# Patient Record
Sex: Female | Born: 1980 | Race: White | Hispanic: No | Marital: Single | Smoking: Current every day smoker
Health system: Southern US, Community
[De-identification: ages and names within clinical notes are randomized; demographics above are authoritative.]

## PROBLEM LIST (undated history)

## (undated) DIAGNOSIS — R569 Unspecified convulsions: Secondary | ICD-10-CM

## (undated) DIAGNOSIS — F32A Depression, unspecified: Secondary | ICD-10-CM

## (undated) DIAGNOSIS — K219 Gastro-esophageal reflux disease without esophagitis: Secondary | ICD-10-CM

## (undated) DIAGNOSIS — F329 Major depressive disorder, single episode, unspecified: Secondary | ICD-10-CM

## (undated) DIAGNOSIS — F419 Anxiety disorder, unspecified: Secondary | ICD-10-CM

## (undated) DIAGNOSIS — J45909 Unspecified asthma, uncomplicated: Secondary | ICD-10-CM

## (undated) HISTORY — PX: TONSILLECTOMY: SUR1361

---

## 2018-04-09 DIAGNOSIS — D696 Thrombocytopenia, unspecified: Secondary | ICD-10-CM | POA: Diagnosis not present

## 2018-04-09 DIAGNOSIS — D72819 Decreased white blood cell count, unspecified: Secondary | ICD-10-CM | POA: Diagnosis not present

## 2018-04-09 DIAGNOSIS — T50901A Poisoning by unspecified drugs, medicaments and biological substances, accidental (unintentional), initial encounter: Secondary | ICD-10-CM | POA: Diagnosis not present

## 2018-04-09 DIAGNOSIS — R740 Nonspecific elevation of levels of transaminase and lactic acid dehydrogenase [LDH]: Secondary | ICD-10-CM | POA: Diagnosis not present

## 2018-04-10 DIAGNOSIS — T1491XA Suicide attempt, initial encounter: Secondary | ICD-10-CM | POA: Diagnosis not present

## 2018-04-10 DIAGNOSIS — D696 Thrombocytopenia, unspecified: Secondary | ICD-10-CM | POA: Diagnosis not present

## 2018-04-10 DIAGNOSIS — D72819 Decreased white blood cell count, unspecified: Secondary | ICD-10-CM | POA: Diagnosis not present

## 2018-04-10 DIAGNOSIS — F1010 Alcohol abuse, uncomplicated: Secondary | ICD-10-CM | POA: Diagnosis not present

## 2018-04-10 DIAGNOSIS — R740 Nonspecific elevation of levels of transaminase and lactic acid dehydrogenase [LDH]: Secondary | ICD-10-CM | POA: Diagnosis not present

## 2018-04-10 DIAGNOSIS — T50901A Poisoning by unspecified drugs, medicaments and biological substances, accidental (unintentional), initial encounter: Secondary | ICD-10-CM | POA: Diagnosis not present

## 2018-04-11 ENCOUNTER — Other Ambulatory Visit: Payer: Self-pay

## 2018-04-11 ENCOUNTER — Inpatient Hospital Stay (HOSPITAL_COMMUNITY)
Admission: AD | Admit: 2018-04-11 | Discharge: 2018-04-13 | DRG: 885 | Disposition: A | Discharge status: Home / Self Care | Payer: Medicaid Other | Source: Intra-hospital | Attending: Internal Medicine | Admitting: Internal Medicine

## 2018-04-11 ENCOUNTER — Encounter (HOSPITAL_COMMUNITY): Payer: Self-pay

## 2018-04-11 ENCOUNTER — Emergency Department (HOSPITAL_COMMUNITY): Admission: EM | Admit: 2018-04-11 | Payer: Self-pay | Source: Home / Self Care

## 2018-04-11 DIAGNOSIS — R569 Unspecified convulsions: Secondary | ICD-10-CM | POA: Diagnosis not present

## 2018-04-11 DIAGNOSIS — Z915 Personal history of self-harm: Secondary | ICD-10-CM | POA: Diagnosis not present

## 2018-04-11 DIAGNOSIS — F332 Major depressive disorder, recurrent severe without psychotic features: Principal | ICD-10-CM | POA: Diagnosis present

## 2018-04-11 DIAGNOSIS — N390 Urinary tract infection, site not specified: Secondary | ICD-10-CM | POA: Diagnosis not present

## 2018-04-11 DIAGNOSIS — R740 Nonspecific elevation of levels of transaminase and lactic acid dehydrogenase [LDH]: Secondary | ICD-10-CM | POA: Diagnosis not present

## 2018-04-11 DIAGNOSIS — Z888 Allergy status to other drugs, medicaments and biological substances status: Secondary | ICD-10-CM | POA: Diagnosis not present

## 2018-04-11 DIAGNOSIS — Z8669 Personal history of other diseases of the nervous system and sense organs: Secondary | ICD-10-CM | POA: Diagnosis not present

## 2018-04-11 DIAGNOSIS — R443 Hallucinations, unspecified: Secondary | ICD-10-CM | POA: Diagnosis present

## 2018-04-11 DIAGNOSIS — F10231 Alcohol dependence with withdrawal delirium: Secondary | ICD-10-CM | POA: Diagnosis not present

## 2018-04-11 DIAGNOSIS — F419 Anxiety disorder, unspecified: Secondary | ICD-10-CM | POA: Diagnosis not present

## 2018-04-11 DIAGNOSIS — D539 Nutritional anemia, unspecified: Secondary | ICD-10-CM | POA: Diagnosis not present

## 2018-04-11 DIAGNOSIS — T50901A Poisoning by unspecified drugs, medicaments and biological substances, accidental (unintentional), initial encounter: Secondary | ICD-10-CM | POA: Diagnosis not present

## 2018-04-11 DIAGNOSIS — R45851 Suicidal ideations: Secondary | ICD-10-CM | POA: Diagnosis present

## 2018-04-11 DIAGNOSIS — Z79899 Other long term (current) drug therapy: Secondary | ICD-10-CM | POA: Diagnosis not present

## 2018-04-11 DIAGNOSIS — F502 Bulimia nervosa: Secondary | ICD-10-CM | POA: Diagnosis not present

## 2018-04-11 DIAGNOSIS — R4182 Altered mental status, unspecified: Secondary | ICD-10-CM | POA: Diagnosis not present

## 2018-04-11 DIAGNOSIS — F1010 Alcohol abuse, uncomplicated: Secondary | ICD-10-CM

## 2018-04-11 DIAGNOSIS — D72819 Decreased white blood cell count, unspecified: Secondary | ICD-10-CM | POA: Diagnosis not present

## 2018-04-11 DIAGNOSIS — T1491XA Suicide attempt, initial encounter: Secondary | ICD-10-CM

## 2018-04-11 DIAGNOSIS — R451 Restlessness and agitation: Secondary | ICD-10-CM | POA: Diagnosis not present

## 2018-04-11 DIAGNOSIS — F10931 Alcohol use, unspecified with withdrawal delirium: Secondary | ICD-10-CM

## 2018-04-11 DIAGNOSIS — K0889 Other specified disorders of teeth and supporting structures: Secondary | ICD-10-CM | POA: Diagnosis not present

## 2018-04-11 DIAGNOSIS — D696 Thrombocytopenia, unspecified: Secondary | ICD-10-CM | POA: Diagnosis not present

## 2018-04-11 DIAGNOSIS — I82409 Acute embolism and thrombosis of unspecified deep veins of unspecified lower extremity: Secondary | ICD-10-CM | POA: Diagnosis not present

## 2018-04-11 DIAGNOSIS — F17210 Nicotine dependence, cigarettes, uncomplicated: Secondary | ICD-10-CM | POA: Diagnosis present

## 2018-04-11 DIAGNOSIS — R590 Localized enlarged lymph nodes: Secondary | ICD-10-CM | POA: Diagnosis not present

## 2018-04-11 MED ORDER — TRAZODONE HCL 50 MG PO TABS
50.0000 mg | ORAL_TABLET | Freq: Every evening | ORAL | Status: DC | PRN
  Administered 2018-04-13: 50 mg via ORAL
  Filled 2018-04-11: qty 1

## 2018-04-11 MED ORDER — LORAZEPAM 2 MG/ML IJ SOLN: 0.0000 mg | Freq: Two times a day (BID) | INTRAMUSCULAR | Status: DC

## 2018-04-11 MED ORDER — MAGNESIUM HYDROXIDE 400 MG/5ML PO SUSP: 30.0000 mL | Freq: Every day | ORAL | Status: DC | PRN

## 2018-04-11 MED ORDER — LORAZEPAM 1 MG PO TABS: 0.0000 mg | ORAL_TABLET | Freq: Two times a day (BID) | ORAL | Status: DC

## 2018-04-11 MED ORDER — VITAMIN B-1 100 MG PO TABS: 100.0000 mg | ORAL_TABLET | Freq: Every day | ORAL | Status: DC

## 2018-04-11 MED ORDER — LORAZEPAM 1 MG PO TABS: 0.0000 mg | ORAL_TABLET | Freq: Four times a day (QID) | ORAL | Status: DC

## 2018-04-11 MED ORDER — ALUM & MAG HYDROXIDE-SIMETH 200-200-20 MG/5ML PO SUSP: 30.0000 mL | ORAL | Status: DC | PRN

## 2018-04-11 MED ORDER — HYDROXYZINE HCL 25 MG PO TABS: 25.0000 mg | ORAL_TABLET | Freq: Four times a day (QID) | ORAL | Status: DC | PRN

## 2018-04-11 MED ORDER — THIAMINE HCL 100 MG/ML IJ SOLN: 100.0000 mg | Freq: Every day | INTRAMUSCULAR | Status: DC

## 2018-04-11 MED ORDER — ACETAMINOPHEN 325 MG PO TABS: 650.0000 mg | ORAL_TABLET | Freq: Four times a day (QID) | ORAL | Status: DC | PRN

## 2018-04-11 MED ORDER — LORAZEPAM 2 MG/ML IJ SOLN
0.0000 mg | Freq: Four times a day (QID) | INTRAMUSCULAR | Status: DC
  Filled 2018-04-11: qty 1

## 2018-04-11 NOTE — Tx Team (Signed)
Initial Treatment Plan 04/11/2018 5:34 PM Jennifer Compton ZOX:[id number]    PATIENT STRESSORS: Financial difficulties   PATIENT STRENGTHS: Supportive family/friends   PATIENT IDENTIFIED PROBLEMS: Anxiety  Substance abuse  "coping skills"  "stop drinking alcohol"               DISCHARGE CRITERIA:  Improved stabilization in mood, thinking, and/or behavior Medical problems require only outpatient monitoring  PRELIMINARY DISCHARGE PLAN: Attend aftercare/continuing care group  PATIENT/FAMILY INVOLVEMENT: This treatment plan has been presented to and reviewed with the patient, Jennifer Compton, and/or family member.  The patient and family have been given the opportunity to ask questions and make suggestions.  Bethann Punches, RN 04/11/2018, 5:34 PM

## 2018-04-11 NOTE — Progress Notes (Cosign Needed)
Jennifer Compton is a 37 y.o. female Voluntary admitted for overdosing on her medications. Pt stated she 8 Trazodone and 2 Benadryl not in an attempt to kill herself but to try and go to sleep. Pt stated her goal is to learn coping skill on how to deal with depression and drinking. Pt alert and oriented x 4, denied SI/HI, AVH. Consents signed, skin/belongings search completed and pt oriented to unit. Pt stable at this time. Pt given the opportunity to express concerns and ask questions. Pt given toiletries. Will continue to monitor.

## 2018-04-11 NOTE — ED Provider Notes (Addendum)
[name] EMERGENCY DEPARTMENT Provider Note   CSN: [id number] Arrival date & time: 04/11/18  1546     History   Chief Complaint Chief Complaint  Patient presents with  . Seizures    HPI Jennifer Compton is a 37 y.o. female.  Patient presents to the emergency department with a chief complaint of seizures.  She is admitted to behavioral health for suicidal ideation and alcohol detox.  She was originally seen at [facility] on Friday (3 days ago).  She reports she had taken 8 trazodone and 2 Benadryl.  Upon being medically cleared, she was transferred to [facility] for further psychiatric care.  She states that today she was in an AA meeting and had a seizure.  The caregiver is with her, and states that she was seated.  She did not fall.  She did not injure herself.  She states that she has felt slightly shaky since, did have one episode of vomiting.  She states that she feels improved now.  She was sent to the emergency department for evaluation from behavioral health.  The history is provided by the patient. No language interpreter was used.    History reviewed. No pertinent past medical history.  Patient Active Problem List   Diagnosis Date Noted  . MDD (major depressive disorder), recurrent severe, without psychosis (HCC) 04/11/2018    History reviewed. No pertinent surgical history.   OB History   None      Home Medications    Prior to Admission medications   Medication Sig Start Date End Date Taking? Authorizing Provider  acetaminophen (TYLENOL) 325 MG tablet Take 650 mg by mouth every 6 (six) hours as needed for mild pain.    [provider]  aluminum-magnesium hydroxide-simethicone (MAALOX) 200-200-20 MG/5ML SUSP Take 30 mLs by mouth 4 (four) times daily -  before meals and at bedtime.    [provider]  traZODone (DESYREL) 50 MG tablet Take 50 mg by mouth at bedtime as needed for sleep.    [provider]    Family  History History reviewed. No pertinent family history.  Social History Social History   Tobacco Use  . Smoking status: Current Every Day Smoker  . Smokeless tobacco: Current User  Substance Use Topics  . Alcohol use: Yes  . Drug use: Not Currently     Allergies   Codeine   Review of Systems Review of Systems  All other systems reviewed and are negative.    Physical Exam Updated Vital Signs BP (!) 134/105   Pulse 85   Temp 98.6 F (37 C) (Oral)   Resp 17   Ht  (1.702 m)   Wt 52.6 kg (116 lb)   SpO2 100%   BMI 18.17 kg/m   Physical Exam  Constitutional: She is oriented to person, place, and time. She appears well-developed and well-nourished.  HENT:  Head: Normocephalic and atraumatic.  Eyes: Pupils are equal, round, and reactive to light. Conjunctivae and EOM are normal.  Neck: Normal range of motion. Neck supple.  Cardiovascular: Normal rate and regular rhythm. Exam reveals no gallop and no friction rub.  No murmur heard. Pulmonary/Chest: Effort normal and breath sounds normal. No respiratory distress. She has no wheezes. She has no rales. She exhibits no tenderness.  Abdominal: Soft. Bowel sounds are normal. She exhibits no distension and no mass. There is no tenderness. There is no rebound and no guarding.  Musculoskeletal: Normal range of motion. She exhibits no edema  or tenderness.  Neurological: She is alert and oriented to person, place, and time.  Mildly tremulous  Skin: Skin is warm and dry.  Psychiatric: She has a normal mood and affect. Her behavior is normal. Judgment and thought content normal.  Nursing note and vitals reviewed.    ED Treatments / Results  Labs (all labs ordered are listed, but only abnormal results are displayed) Labs Reviewed  CBC  COMPREHENSIVE METABOLIC PANEL  TSH  RAPID URINE DRUG SCREEN, HOSP PERFORMED  PREGNANCY, URINE  URINALYSIS, ROUTINE W REFLEX MICROSCOPIC  I-STAT BETA HCG BLOOD, ED ([facility], [facility], [facility] ONLY)     EKG None  Radiology No results found.  Procedures Procedures (including critical care time)  Medications Ordered in ED Medications  acetaminophen (TYLENOL) tablet 650 mg (has no administration in time range)  alum & mag hydroxide-simeth (MAALOX/MYLANTA) 200-200-20 MG/5ML suspension 30 mL (has no administration in time range)  magnesium hydroxide (MILK OF MAGNESIA) suspension 30 mL (has no administration in time range)  hydrOXYzine (ATARAX/VISTARIL) tablet 25 mg (has no administration in time range)  traZODone (DESYREL) tablet 50 mg (has no administration in time range)  LORazepam (ATIVAN) injection 0-4 mg (has no administration in time range)    Or  LORazepam (ATIVAN) tablet 0-4 mg (has no administration in time range)  LORazepam (ATIVAN) injection 0-4 mg (has no administration in time range)    Or  LORazepam (ATIVAN) tablet 0-4 mg (has no administration in time range)  thiamine (VITAMIN B-1) tablet 100 mg (has no administration in time range)    Or  thiamine (B-1) injection 100 mg (has no administration in time range)     Initial Impression / Assessment and Plan / ED Course  I have reviewed the triage vital signs and the nursing notes.  Pertinent labs & imaging results that were available during my care of the patient were reviewed by me and considered in my medical decision making (see chart for details).     Patient sent from behavioral health for seizure.  She was admitted for alcohol detox as well as suicidal ideation.  Her last drink was 3 days ago.  She normally drinks 12 beers per day.  Now, she is slightly tremulous and hypertensive.  Question acute withdrawal.  Will give Ativan and check CIWA.  CIWA is 3.  No complaints.  Will discharge back to [facility]  7:01 AM Patient had seizure in the bathroom.  Given 2 rounds of  ativan back to back by Dr. Manus Gunning.  Followed by  versed.  Dr. Manus Gunning ordered precedex.  Patient signed out to Ardelle Park, who will continue  care.  Final Clinical Impressions(s) / ED Diagnoses   Final diagnoses:  Seizure [facility])    ED Discharge Orders    None       Roxy Horseman, PA-C 04/12/18 0459    Roxy Horseman, PA-C 04/12/18 1610    Glynn Octave, MD 04/12/18 [phone]

## 2018-04-11 NOTE — Progress Notes (Signed)
Asked to examine patient post seizure like activity. Patient was admitted to [facility] today from [facility] after a overdose of trazodone in a suicide attempt. Patient has a history of alcoholism and bulimia. Per records patient has a 15 year history of alcoholism and drinks 10 or more 24 ounce beers per day. Seizure activity was witnessed and lasted approximately 2 minutes. Patient was sitting when activity began and was assisted to floor by staff. On exam patient is disoriented to place, time, and situation. No evidence of injury. Patient is unable to provide any information on history at this time. No history of a seizure disorder is noted in records from [facility]. Will send to [facility] for further examination.

## 2018-04-11 NOTE — Progress Notes (Signed)
At approx  2049 pt was observed in the group room sitting in a chair having jerking seizure like activity    This activity lasted 2 to 3 min she was lowered to the floor by staff and kept safe and when the patient stopped activity she was confused and having difficulty following directions  Pt did not remember what happened and said she has never had a seizure  Pt was examined by NP and it was decided to send her to the emergency room for further examination  Pt left via EMS around 2100

## 2018-04-11 NOTE — ED Triage Notes (Signed)
Pt from [facility] after having a tonic-clonic seizure lasting 3 minutes. No hx of seizures. Pt at [facility] for detox from alcohol and trazadone.

## 2018-04-11 NOTE — BH Assessment (Signed)
Tele Assessment Note   Patient Name: Jennifer Compton MRN: [id number] Referring Physician: Jola Compton Location of Patient: [facility] ED Location of Provider: [facility] Jennifer Compton is an 37 y.o. female.Patient is a 37 year old female present to [facility] ED via EMS after her boyfriend called 911. Patient report 911 was called after her boyfriend was unable to wake her. Patient denies she attempted suicide via overdose. Report she has Thyroid issues and just wanted to sleep. Report she took around 8 Trazodone and 2 Benadryl. Report she has a history of depression but denies being currently being depressed. Denies homicidal ideations, denies auditory / visual hallucinations. Denies history of suicidal intent, denies history of inpatient hospitalization. Report her psychiatrist; Jennifer Compton in [place]. Denies issues with appetite. Report with falling back to sleep once awaken during the night. Report goes to bed around 10pm then wakes around 3am and is unable to fall back a sleep. Denies history of trauma or upcoming court dates.   Collateral information from patient's mother contradicts information patient stated.      Collateral:   Jennifer Compton - mother [phone]  Patient's mother report patient started drinking at the age 71 with heavy usage age 65 or 74. Report patient lost her license age 36 after receiving 3 DUI's in a one year period. Report patient is also Bulmanic (does not know how many times per day patient throw-up because she does not live with her). Behavior started around 37 year old to present. Also age of 82 patient started cutting. All the behaviors started around the time patient's parents divorced. Report patient sent mother a text the day of the incident around 2:30pm stating 'Love you Mom'. Mother report patient does not do that. Report the day of the incident was different. Report in the past when patient had attempted suicide she would call or text someone what she had  done, mother report attention seeking behaviors. Report patient attempted suicide March 2019 via overdosing on Klonopin (consumed 10-20 pills). Spent 5 days in the [place] trauma unit. Patient discharged to a rehab hospital near [place]. Report patient has been to 7 or 8 rehab since the age to 37 year old.  Report patient has attempted suicide around 5 times. Report patient drinks between 16-20 beers (case) daily by herself. Report every person she lives with asks her to leave. Report patient just moved into the home with her grandmother. Denies patient is not physically abusive towards others, however, report patient is verbally and emotionally abusive towards others when she's drinking. Report patient behavior yesterday was not her normal. Report when boyfriend went to wake patient she had defecated on herself and was choking on her own vomit. Boyfriend immediately called 911.       F33.2 Major depressive disorder, Recurrent episode, Severe F10.20 Alcohol use disorder, Severe    Diagnosis:  Past Medical History: No past medical history on file.   Family History: No family history on file.  Social History:  has no tobacco, alcohol, and drug history on file.  Additional Social History:  Alcohol / Drug Use Pain Medications: See MAR Prescriptions: See MAR Over the Counter: See MAR History of alcohol / drug use?: Yes Longest period of sobriety (when/how long): [facility] Negative Consequences of Use: Financial, Personal relationships, Work / School Substance #1 Name of Substance 1: Etoh 1 - Age of First Use: [age] 1 - Amount (size/oz): 5 24 oz beers 1 - Frequency: daily 1 - Duration: Ongoing 1 - Last Use / Amount: 04/09/18  CIWA:  COWS:    Allergies: Allergies not on file  Home Medications:  No medications prior to admission.    OB/GYN Status:  No LMP recorded.  General Assessment Data Location of Assessment: [facility]) TTS Assessment: Out of system Is this a Tele or Face-to-Face  Assessment?: Tele Assessment Is this an Initial Assessment or a Re-assessment for this encounter?: Initial Assessment Marital status: Single Is patient pregnant?: Unknown Pregnancy Status: Unknown Living Arrangements: Spouse/significant other Can pt return to current living arrangement?: Yes Admission Status: Voluntary Is patient capable of signing voluntary admission?: Yes Referral Source: Self/Family/Friend Insurance type: [facility]     Crisis Care Plan Living Arrangements: Spouse/significant other Name of Psychiatrist: [facility] Name of Therapist: .Jennifer Compton  Education Status Is patient currently in school?: No Is the patient employed, unemployed or receiving disability?: Receiving disability income  Risk to self with the past 6 months Suicidal Ideation: Yes-Currently Present(Pt denies) Has patient been a risk to self within the past 6 months prior to admission? : No Suicidal Intent: Yes-Currently Present Has patient had any suicidal intent within the past 6 months prior to admission? : No Is patient at risk for suicide?: Yes Suicidal Plan?: Yes-Currently Present Has patient had any suicidal plan within the past 6 months prior to admission? : No Specify Current Suicidal Plan: Pt drank 5 24 oz beers, 2 Benadryl, and 8 Trazadone Access to Means: Yes Specify Access to Suicidal Means: Beer and medication What has been your use of drugs/alcohol within the last 12 months?: Etoh Benzos Previous Attempts/Gestures: Yes How many times?: (Multiple) Other Self Harm Risks: None Triggers for Past Attempts: Unknown Intentional Self Injurious Behavior: None Family Suicide History: Unknown Recent stressful life event(s): Jennifer Compton) Persecutory voices/beliefs?: No Depression: Yes Depression Symptoms: Despondent, Tearfulness, Isolating, Loss of interest in usual pleasures, Feeling worthless/self pity Substance abuse history and/or treatment for substance abuse?: No Suicide prevention information given  to non-admitted patients: Not applicable  Risk to Others within the past 6 months Homicidal Ideation: No Does patient have any lifetime risk of violence toward others beyond the six months prior to admission? : No Thoughts of Harm to Others: No Current Homicidal Intent: No Current Homicidal Plan: No History of harm to others?: No Assessment of Violence: None Noted Does patient have access to weapons?: No Criminal Charges Pending?: No Does patient have a court date: No Is patient on probation?: No  Psychosis Hallucinations: None noted Delusions: None noted  Mental Status Report Appearance/Hygiene: Unremarkable, In scrubs Eye Contact: Unable to Assess Motor Activity: Freedom of movement Speech: Logical/coherent Level of Consciousness: Alert Mood: Depressed Affect: Appropriate to circumstance Anxiety Level: Minimal Thought Processes: Coherent, Relevant Judgement: Impaired Orientation: Person, Place, Time, Situation, Appropriate for developmental age Obsessive Compulsive Thoughts/Behaviors: None  Cognitive Functioning Concentration: Normal Memory: Recent Intact Is patient IDD: No Is patient DD?: No Insight: Poor Impulse Control: Poor Appetite: Poor Have you had any weight changes? : No Change Sleep: Unable to Assess Total Hours of Sleep: Jennifer Compton) Vegetative Symptoms: None  ADLScreening [facility] Services) Patient's cognitive ability adequate to safely complete daily activities?: Yes Patient able to express need for assistance with ADLs?: Yes Independently performs ADLs?: Yes (appropriate for developmental age)  Prior Inpatient Therapy Prior Inpatient Therapy: No     ADL Screening (condition at time of admission) Patient's cognitive ability adequate to safely complete daily activities?: Yes Is the patient deaf or have difficulty hearing?: No Does the patient have difficulty seeing, even when wearing glasses/contacts?: No Does the patient have difficulty  concentrating, remembering, or making decisions?: No Patient able to express need for assistance with ADLs?: Yes Does the patient have difficulty dressing or bathing?: No Independently performs ADLs?: Yes (appropriate for developmental age) Does the patient have difficulty walking or climbing stairs?: No Weakness of Legs: None Weakness of Arms/Hands: None  Home Assistive Devices/Equipment Home Assistive Devices/Equipment: None  Therapy Consults (therapy consults require a physician order) PT Evaluation Needed: No OT Evalulation Needed: No SLP Evaluation Needed: No Abuse/Neglect Assessment (Assessment to be complete while patient is alone) Abuse/Neglect Assessment Can Be Completed: Unable to assess, patient is non-responsive or altered mental status Values / Beliefs Cultural Requests During Hospitalization: None Spiritual Requests During Hospitalization: None Consults Spiritual Care Consult Needed: No Social Work Consult Needed: No Merchant navy officer (For Healthcare) Does Patient Have a Medical Advance Directive?: No Would patient like information on creating a medical advance directive?: No - Patient declined    Additional Information 1:1 In Past 12 Months?: No CIRT Risk: No Elopement Risk: No Does patient have medical clearance?: Yes     Disposition:  Disposition Initial Assessment Completed for this Encounter: Yes Disposition of Patient: Admit([facility] 304-2) Type of inpatient treatment program: Adult   Per Claudette Head, DNP pt meets inpatient criteria. Pt accepted to [facility] 304-2. Y  This service was provided via telemedicine using a 2-way, interactive audio and Immunologist.  Names of all persons participating in this telemedicine service and their role in this encounter. Name: Shanele Nissan Role: Pt  Name:  Role:   Name: Role:  Name:  Role:     Danae Orleans , [place], LPCA 04/11/2018 4:05 PM

## 2018-04-12 ENCOUNTER — Inpatient Hospital Stay (HOSPITAL_COMMUNITY): Payer: Medicaid Other

## 2018-04-12 DIAGNOSIS — F10931 Alcohol use, unspecified with withdrawal delirium: Secondary | ICD-10-CM

## 2018-04-12 DIAGNOSIS — N390 Urinary tract infection, site not specified: Secondary | ICD-10-CM

## 2018-04-12 DIAGNOSIS — F332 Major depressive disorder, recurrent severe without psychotic features: Principal | ICD-10-CM

## 2018-04-12 DIAGNOSIS — F10231 Alcohol dependence with withdrawal delirium: Principal | ICD-10-CM

## 2018-04-12 DIAGNOSIS — Z888 Allergy status to other drugs, medicaments and biological substances status: Secondary | ICD-10-CM

## 2018-04-12 DIAGNOSIS — R4182 Altered mental status, unspecified: Secondary | ICD-10-CM

## 2018-04-12 DIAGNOSIS — I82409 Acute embolism and thrombosis of unspecified deep veins of unspecified lower extremity: Secondary | ICD-10-CM

## 2018-04-12 DIAGNOSIS — R740 Nonspecific elevation of levels of transaminase and lactic acid dehydrogenase [LDH]: Secondary | ICD-10-CM

## 2018-04-12 DIAGNOSIS — R569 Unspecified convulsions: Secondary | ICD-10-CM

## 2018-04-12 LAB — COMPREHENSIVE METABOLIC PANEL
ALBUMIN: 3.6 g/dL (ref 3.5–5.0)
ALK PHOS: 45 U/L (ref 38–126)
ALT: 97 U/L — ABNORMAL HIGH (ref 14–54)
ANION GAP: 9 (ref 5–15)
AST: 156 U/L — ABNORMAL HIGH (ref 15–41)
BUN: <5 mg/dL — ABNORMAL LOW (ref 6–20)
CALCIUM: 8.8 mg/dL — AB (ref 8.9–10.3)
CHLORIDE: 101 mmol/L (ref 101–111)
CO2: 24 mmol/L (ref 22–32)
Creatinine, Ser: 0.47 mg/dL (ref 0.44–1.00)
GFR calc Af Amer: >60 mL/min (ref >60)
GFR calc non Af Amer: >60 mL/min (ref >60)
GLUCOSE: 108 mg/dL — AB (ref 65–99)
Potassium: 3.6 mmol/L (ref 3.5–5.1)
SODIUM: 134 mmol/L — AB (ref 135–145)
Total Bilirubin: 1.6 mg/dL — ABNORMAL HIGH (ref 0.3–1.2)
Total Protein: 6.7 g/dL (ref 6.5–8.1)

## 2018-04-12 LAB — RAPID URINE DRUG SCREEN, HOSP PERFORMED
AMPHETAMINES: NOT DETECTED
Barbiturates: NOT DETECTED
Benzodiazepines: POSITIVE — AB
Cocaine: NOT DETECTED
Opiates: NOT DETECTED
TETRAHYDROCANNABINOL: NOT DETECTED

## 2018-04-12 LAB — URINALYSIS, ROUTINE W REFLEX MICROSCOPIC
Bilirubin Urine: NEGATIVE
GLUCOSE, UA: NEGATIVE mg/dL
HGB URINE DIPSTICK: NEGATIVE
KETONES UR: 5 mg/dL — AB
Nitrite: POSITIVE — AB
PROTEIN: NEGATIVE mg/dL
Specific Gravity, Urine: 1.012 (ref 1.005–1.030)
pH: 8 (ref 5.0–8.0)

## 2018-04-12 LAB — PROTIME-INR
INR: 1.06
Prothrombin Time: 13.7 s (ref 11.4–15.2)

## 2018-04-12 LAB — CBC
HCT: 34.9 % — ABNORMAL LOW (ref 36.0–46.0)
HEMOGLOBIN: 11.9 g/dL — AB (ref 12.0–15.0)
MCH: 33.7 pg (ref 26.0–34.0)
MCHC: 34.1 g/dL (ref 30.0–36.0)
MCV: 98.9 fL (ref 78.0–100.0)
Platelets: 80 10*3/uL — ABNORMAL LOW (ref 150–400)
RBC: 3.53 MIL/uL — AB (ref 3.87–5.11)
RDW: 13.2 % (ref 11.5–15.5)
WBC: 5.6 10*3/uL (ref 4.0–10.5)

## 2018-04-12 LAB — I-STAT BETA HCG BLOOD, ED (MC, WL, AP ONLY)

## 2018-04-12 LAB — CBG MONITORING, ED: Glucose-Capillary: 158 mg/dL — ABNORMAL HIGH (ref 65–99)

## 2018-04-12 LAB — PREGNANCY, URINE: Preg Test, Ur: NEGATIVE

## 2018-04-12 LAB — MAGNESIUM: Magnesium: 1.6 mg/dL — ABNORMAL LOW (ref 1.7–2.4)

## 2018-04-12 LAB — PHOSPHORUS: Phosphorus: 3.3 mg/dL (ref 2.5–4.6)

## 2018-04-12 MED ORDER — SODIUM CHLORIDE 0.9% FLUSH: 3.0000 mL | Freq: Two times a day (BID) | INTRAVENOUS | Status: DC

## 2018-04-12 MED ORDER — FOLIC ACID 1 MG PO TABS
1.0000 mg | ORAL_TABLET | Freq: Every day | ORAL | Status: DC
  Administered 2018-04-12 – 2018-04-13 (×2): 1 mg via ORAL
  Filled 2018-04-12 (×2): qty 1

## 2018-04-12 MED ORDER — VITAMIN B-1 100 MG PO TABS
100.0000 mg | ORAL_TABLET | Freq: Every day | ORAL | Status: DC
  Administered 2018-04-12 – 2018-04-13 (×2): 100 mg via ORAL
  Filled 2018-04-12 (×2): qty 1

## 2018-04-12 MED ORDER — MAGNESIUM SULFATE 4 GM/100ML IV SOLN
4.0000 g | Freq: Once | INTRAVENOUS | Status: AC
  Administered 2018-04-12: 4 g via INTRAVENOUS
  Filled 2018-04-12: qty 100

## 2018-04-12 MED ORDER — POLYETHYLENE GLYCOL 3350 17 G PO PACK: 17.0000 g | PACK | Freq: Every day | ORAL | Status: DC | PRN

## 2018-04-12 MED ORDER — MIDAZOLAM HCL 2 MG/2ML IJ SOLN
INTRAMUSCULAR | Status: AC
  Administered 2018-04-12: 07:00:00
  Filled 2018-04-12: qty 2

## 2018-04-12 MED ORDER — LEVETIRACETAM IN NACL 500 MG/100ML IV SOLN
500.0000 mg | Freq: Two times a day (BID) | INTRAVENOUS | Status: DC
  Administered 2018-04-12 – 2018-04-13 (×2): 500 mg via INTRAVENOUS
  Filled 2018-04-12 (×3): qty 100

## 2018-04-12 MED ORDER — CHLORDIAZEPOXIDE HCL 5 MG PO CAPS
25.0000 mg | ORAL_CAPSULE | Freq: Once | ORAL | Status: AC
Start: 2018-04-12 — End: 2018-04-12
  Administered 2018-04-12: 25 mg via ORAL
  Filled 2018-04-12: qty 5

## 2018-04-12 MED ORDER — CEFTRIAXONE SODIUM 1 G IJ SOLR
1.0000 g | Freq: Once | INTRAMUSCULAR | Status: AC
  Administered 2018-04-12: 1 g via INTRAVENOUS
  Filled 2018-04-12: qty 10

## 2018-04-12 MED ORDER — MIDAZOLAM HCL 2 MG/2ML IJ SOLN
4.0000 mg | Freq: Once | INTRAMUSCULAR | Status: AC
  Administered 2018-04-12: 4 mg via INTRAVENOUS
  Filled 2018-04-12: qty 4

## 2018-04-12 MED ORDER — DEXMEDETOMIDINE HCL IN NACL 200 MCG/50ML IV SOLN
0.4000 ug/kg/h | INTRAVENOUS | Status: DC
  Administered 2018-04-12: 0.4 ug/kg/h via INTRAVENOUS
  Filled 2018-04-12: qty 50

## 2018-04-12 MED ORDER — ADULT MULTIVITAMIN W/MINERALS CH
1.0000 | ORAL_TABLET | Freq: Every day | ORAL | Status: DC
  Administered 2018-04-12 – 2018-04-13 (×2): 1 via ORAL
  Filled 2018-04-12 (×2): qty 1

## 2018-04-12 MED ORDER — SODIUM CHLORIDE 0.9 % IV SOLN
INTRAVENOUS | Status: DC
  Administered 2018-04-12 – 2018-04-13 (×3): via INTRAVENOUS

## 2018-04-12 MED ORDER — LEVETIRACETAM IN NACL 1000 MG/100ML IV SOLN
1000.0000 mg | Freq: Once | INTRAVENOUS | Status: AC
  Administered 2018-04-12: 1000 mg via INTRAVENOUS
  Filled 2018-04-12: qty 100

## 2018-04-12 MED ORDER — CHLORDIAZEPOXIDE HCL 5 MG PO CAPS
5.0000 mg | ORAL_CAPSULE | Freq: Once | ORAL | Status: AC
  Administered 2018-04-12: 5 mg via ORAL
  Filled 2018-04-12: qty 1

## 2018-04-12 MED ORDER — LORAZEPAM 2 MG/ML IJ SOLN
2.0000 mg | Freq: Once | INTRAMUSCULAR | Status: AC
  Administered 2018-04-12: 2 mg via INTRAVENOUS

## 2018-04-12 MED ORDER — MIDAZOLAM HCL 2 MG/2ML IJ SOLN
2.0000 mg | Freq: Once | INTRAMUSCULAR | Status: AC
  Administered 2018-04-12: 2 mg via INTRAVENOUS

## 2018-04-12 MED ORDER — LORAZEPAM 2 MG/ML IJ SOLN: 2.0000 mg | INTRAMUSCULAR | Status: DC | PRN

## 2018-04-12 MED ORDER — LORAZEPAM 2 MG/ML IJ SOLN
INTRAMUSCULAR | Status: AC
  Filled 2018-04-12: qty 1

## 2018-04-12 MED ORDER — ENOXAPARIN SODIUM 40 MG/0.4ML ~~LOC~~ SOLN
40.0000 mg | SUBCUTANEOUS | Status: DC
  Administered 2018-04-12 – 2018-04-13 (×2): 40 mg via SUBCUTANEOUS
  Filled 2018-04-12 (×2): qty 0.4

## 2018-04-12 NOTE — ED Notes (Signed)
Pt had witnessed tonic/conic seizure in bathroom has she was trying to have a bowel movement. Pt taken back to room and MD at bedside.

## 2018-04-12 NOTE — ED Notes (Signed)
Dinner tray ordered for patient.

## 2018-04-12 NOTE — H&P (Addendum)
Date: 04/12/2018               Patient Name:  Jennifer Compton MRN: [id number]  DOB: [birth year] Age / Sex: 37 y.o., female   PCP: Selina Cooley, MD         Medical Service: Internal Medicine Teaching Service         Attending Physician: Dr. Sandre Kitty Elwin Mocha, MD    First Contact: Dr. Rozann Lesches Pager: [phone]  Second Contact: Dr. Arnetha Courser Pager: [phone]       After Hours (After 5p/  First Contact Pager: [phone]  weekends / holidays): Second Contact Pager: [phone]   Chief Complaint: Altered mental status  History of Present Illness:  Jennifer Compton is a 37 yo with PMH of major depression and alcohol use who is presenting for evaluation of witnessed seizure activity and altered mental status. History obtained directly from the patient, with assistance of ED personnel and chart review as the patient was heavily sedated after receiving IV Precedex, Versed, and ativan for witnessed seizures and agitation in ED prior to interview. Patient was intermittently confused about setting (thought she was in [place]), time (couldn't recall month, knew year 2019), and president (Obama). Patient states that she has no acute complaints, denies headaches, chest pain, fevers, dysuria. States that she previously drank 12 cans of natural ice a day but abruptly quit drinking approximately 4 days ago.   Per chart review patient was recently transferred to behavioral health hospital on 04/11/2017 after presenting to [facility] ED on 04/08/2018 with potential drug overdose consisting of 8 Trazodone and 2 benadryl pills. Patient denied suicidal ideation at that time, but rather stated that she took the pills because she "just wanted to fall asleep". Patient continuously stated that she did not want to intentionally hurt herself, but does did want to stop drinking. While at behavioral health for treatment of possible suicidal ideation and alcohol detoxification, the patient had a witnessed seizure (about 72  hours after complete alcohol cessation). Patient transferred to [facility] ED for treatment and had additional episode of witnessed seizure activity while going to the bathroom. She was given 2 mg Ativan followed by 2 mg Versed for acute treatment and loaded with Keppra 1000 mg. Patient had ongoing agitation and was combative with providers in the ED, eventually requiring Precedex drip for management of agitation. Critical care was consulted and added 25 mg Librium to patient's regimen, after which she calmed down significantly and the Precedex drip was discontinued. Bedside sitter endorsed possible hallucinations, garbled speech, confusion prior to initiation of Precedex drip as well as combative behavior and agitation (pulling at IV, monitoring lines, etc.).   Upon arrival to the ED patient was afebrile, non-tachycardic, normotensive, and saturating >98% on room air. CMP remarkable for AST =156, ALT =97, Total Bilirubin =1.6, and Na =134. CBC with Hgb =11.9 and platelets =80. Urinalysis resulted + nitrites, small LE, many bacteria, and 0-5 squamous cells. UDS + for benzodiazepines. Patient given dose of ceftriaxone in ED for possible UTI and IMTS called for management of acute alcohol withdrawal.   Meds:  No outpatient medications have been marked as taking for the 04/11/18 encounter [facility] Encounter).   Allergies: Allergies as of 04/11/2018 - Review Complete 04/11/2018  Allergen Reaction Noted  . Codeine Nausea And Vomiting 04/11/2018   Past Medical History: History unable to be obtained 2/2 altered mental status   Family History:  History unable to be obtained 2/2 altered mental status  Social  History: Unclear living situation, as interview limited 2/2 altered mental status. Per patient and chart review patient drinks at least 12 beers per day. Smokes 1/2 pack per day. No other illicit drugs.   Review of Systems: A complete ROS was negative except as per HPI.   Physical Exam: Blood  pressure 108/78, pulse 71, temperature 98.6 F (37 C), temperature source Oral, resp. rate 16, height  (1.702 m), weight 116 lb (52.6 kg), SpO2 100 %.  Physical Exam  Constitutional:  Appears stated age, sleeping comfortably on ED stretcher. Non-diaphoretic and in no acute distress.  HENT:  Mouth/Throat: Oropharynx is clear and moist. No oropharyngeal exudate.  Eyes: Pupils are equal, round, and reactive to light. Conjunctivae and EOM are normal.  Cardiovascular: Normal rate, regular rhythm and intact distal pulses. Exam reveals no friction rub.  No murmur heard. Respiratory: Effort normal. No respiratory distress. She has no wheezes. She has no rales.  GI: Soft. Bowel sounds are normal. She exhibits no distension. There is no tenderness. There is no rebound.  Musculoskeletal: She exhibits no edema (of bilateral lower extremities) or tenderness (of bilateral lower extremities).  Neurological:  Easily awoken. Not oriented self, setting, or time. Face strength and sensation intact bilaterally. Tongue midline. Gross motor and sensation to light touch of upper and lower extremities intact bilaterally.   Skin: Skin is warm and dry. No rash noted. No erythema.   Assessment & Plan by Problem: Principal Problem:   Alcohol withdrawal (HCC) Active Problems:   MDD (major depressive disorder), recurrent severe, without psychosis (HCC)  Jennifer Compton is a 37 yo with PMH of major depression and alcohol use who presented for evaluation of witnessed seizure activity and altered mental status after abrupt cessation of alcohol use about 4 days prior to admission. She was admitted to the internal medicine teaching service for management. The specific problems addressed during admission are as follows:  Acute alcohol withdrawal, delerium tremens: Patient with abrupt alcohol cessation approximately 4 days ago as a result of hospitalization after possible intentional overdose. She has now developed  ?hallucinations and seizures as a result of acute alcohol withdrawal while undergoing treatment for major depressive disorder at outside facility. Patient loaded with Keppra in ED without additional seizure event. Patient's agitation in ED initially required Precedex drip, however this has since been discontinued given patient's clinical improvement (no agitation and CIWA <10 on admission evaluation). Will continue management of acute alcohol withdrawal with short-acting benzodiazepines. Since patient had good clinical response with addition of librium to regimen, will have low threshold to schedule additional librium if patient's CIWA scores elevate throughout the day. Will also have low threshold to resume Precedex drip if needed for agitation/aggression.  -Admit to step-down -Continue CIWA monitoring with ativan -Continue seizure precautions, safety sitter for agitation -NS @ rate 125 ml/hr -Folic acid 1 mg, thiamin 100 mg, and multivitamin daily -Repeat librium 25 mg again if increased Ativan use throughout day -Continue Keppra 500 mg BID, will likely be able to discontinue once out of withdrawal  -F/u head CT taken in ED  Major depressive disorder, with ?recent intentional overdose: Patient recently admitted to behavioral health, will likely need to return for treatment after recent possible overdose. Will continue with safety sitter bedside during acute alcohol withdrawal and likely continue for SI once out of acute withdrawal window.  -SW consult pending -Safety sitter bedside -Will consider psychiatry consult if needed prior to transfer back to behavior health once mental status improves -Trazodone 50  mg PRN for sleep  Transaminitis: Patient's LFT's elevated on admission with increased T. Bili and thrombocytopenia suggestive of cirrhosis. Patient does not have current diagnosis of cirrhosis, no imaging available, but heavy alcohol use history. Will obtain INR to evaluation synthetic liver  function, trend CMP, and consider additional imaging in the AM once altered mental status improves. -INR, CMP in AM -Consider further imaging for abnormal LFT's in AM  Pyuria, possible UTI: Patient's UA with nitrates, LE, and bacteria. Patient denies dysuria, but history limited 2/2 altered mental status. Patient received dose of ceftriaxone in ED for treatment of possible UTI. Will plan to reassess symptoms once mental status improves and treat appropriately if symptomatic.  -Clinical monitoring, symptom assessment in AM -Will attempt to add on urine culture to previous collection, as patient already received antibiotics  FEN/GI: -Regular diet  -NS @ rate of 125 ml/hr, Mg/Phos pending (will replete as needed)  VTE Prophylaxis: Lovenox daily Code Status: Full  Dispo: Admit patient to Inpatient with expected length of stay greater than 2 midnights.  SignedRozann Lesches, MD 04/12/2018, 11:46 AM  Pager: [phone]

## 2018-04-12 NOTE — ED Notes (Signed)
Hospital bed requested from [facility] 

## 2018-04-12 NOTE — ED Notes (Signed)
Regular lunch tray ordered 

## 2018-04-12 NOTE — ED Provider Notes (Signed)
Received signout from Roxy Horseman, PA-C at the beginning of shift.  This is a 37 year old patient with history of chronic alcohol abuse currently at behavioral health center for detox of alcohol.  Her last drink was 5 days ago.  She was sent here to the ED after patient had seizure-like activity at the facility.  During the ER course, patient had a witnessed generalized tonic-clonic seizure lasting for approximately 2 minutes follows with a post ictal state.  7:30 AM Patient is agitated, removing her clothes, wanting to get out of bed.  This could be post ictal after the seizure activity.  Patient received multiple dose of Ativan's, as well as Versed's, and Precedex.  Patient required multiple reassessments to ensure safety.  Staff at bedside.  Seizure pads in place.  Positive nitrite noted in urine concerning for potential urinary tract infection which may exacerbate the current symptoms.  Will treat with Rocephin, urine culture sent.  7:58 AM For the past hour, patient has received a total of 6 mg of Ativan, 4 mg of Versed, as well as been on a Precedex drip at 0.6 mcg/kg along with a Keppra loading dose of 1000 mg.  Patient still exhibits agitation requiring nursing staff to follow him to manually restraint.  Concern for potential decompensation requiring intubation if her symptoms persists.  Care discussed with Dr. Manus Gunning and Dr. Clarene Duke.  Will consult intensivist for admission to ICU.    8:36 AM Appreciate consultation from intensivist Dr. Wallace Cullens who recommend giving pt  of Librium PO and monitor for 1 hr, if no improvement he will admit pt.  Currently pt still requiring constant monitoring and restraint from staff from leaving her bed.    9:51 AM Pt is now resting and more calmed.  We will monitor closely for any signs of respiratory depression given the moderate amount of sedatives that pt have received.  Will consult medicine for admission.    10:09 AM Appreciate consultation from Dr.  Greig Castilla from Internal Medicine who agrees to see and admit pt if pt can be off the Prexidex drips.  Since pt is resting more comfortable at this time, I will d/c the drip and monitor closely.    CRITICAL CARE Performed by: Fayrene Helper Total critical care time: 60 minutes Critical care time was exclusive of separately billable procedures and treating other patients. Critical care was necessary to treat or prevent imminent or life-threatening deterioration. Critical care was time spent personally by me on the following activities: development of treatment plan with patient and/or surrogate as well as nursing, discussions with consultants, evaluation of patient's response to treatment, examination of patient, obtaining history from patient or surrogate, ordering and performing treatments and interventions, ordering and review of laboratory studies, ordering and review of radiographic studies, pulse oximetry and re-evaluation of patient's condition.    Fayrene Helper, PA-C 04/12/18 1013    Glynn Octave, MD 04/12/18 1818

## 2018-04-12 NOTE — ED Notes (Signed)
Pt is in post ictal state and is aggressive towards staff. Pt is disoriented x4. Pt did not fall during seizure.

## 2018-04-12 NOTE — Progress Notes (Addendum)
All of patient belonging's sent via [facility] to [facility]. Witness Therapist, occupational Elisabeth Cara.

## 2018-04-12 NOTE — H&P (Deleted)
  The note originally documented on this encounter has been moved the the encounter in which it belongs.  

## 2018-04-12 NOTE — ED Notes (Addendum)
Pt sleeping from meds given; CIWA not performed due to pt sedation, will reassess upon patient awakening

## 2018-04-12 NOTE — ED Notes (Signed)
Pt continues to be agitated and trying to get out of bed. Pt is being combative with this nurse and other staff.

## 2018-04-12 NOTE — ED Notes (Signed)
ED Provider at bedside. 

## 2018-04-12 NOTE — ED Notes (Signed)
After approx three (3) hrs of combative behavior, taking multiple staff to aid in ensuring patient safety, patient finally resting and sound asleep.

## 2018-04-13 ENCOUNTER — Inpatient Hospital Stay (HOSPITAL_COMMUNITY)
Admission: AD | Admit: 2018-04-13 | Discharge: 2018-04-18 | DRG: 897 | Disposition: A | Discharge status: Home / Self Care | Payer: Medicaid Other | Source: Intra-hospital | Attending: Oncology | Admitting: Oncology

## 2018-04-13 ENCOUNTER — Encounter (HOSPITAL_COMMUNITY): Payer: Self-pay | Admitting: General Practice

## 2018-04-13 ENCOUNTER — Other Ambulatory Visit: Payer: Self-pay

## 2018-04-13 DIAGNOSIS — Z885 Allergy status to narcotic agent status: Secondary | ICD-10-CM

## 2018-04-13 DIAGNOSIS — F419 Anxiety disorder, unspecified: Secondary | ICD-10-CM | POA: Diagnosis present

## 2018-04-13 DIAGNOSIS — F502 Bulimia nervosa: Secondary | ICD-10-CM | POA: Diagnosis not present

## 2018-04-13 DIAGNOSIS — K7030 Alcoholic cirrhosis of liver without ascites: Secondary | ICD-10-CM | POA: Diagnosis present

## 2018-04-13 DIAGNOSIS — D539 Nutritional anemia, unspecified: Secondary | ICD-10-CM | POA: Diagnosis present

## 2018-04-13 DIAGNOSIS — R569 Unspecified convulsions: Secondary | ICD-10-CM

## 2018-04-13 DIAGNOSIS — T43212A Poisoning by selective serotonin and norepinephrine reuptake inhibitors, intentional self-harm, initial encounter: Secondary | ICD-10-CM | POA: Diagnosis not present

## 2018-04-13 DIAGNOSIS — R451 Restlessness and agitation: Secondary | ICD-10-CM | POA: Diagnosis not present

## 2018-04-13 DIAGNOSIS — D696 Thrombocytopenia, unspecified: Secondary | ICD-10-CM | POA: Diagnosis not present

## 2018-04-13 DIAGNOSIS — Z781 Physical restraint status: Secondary | ICD-10-CM | POA: Diagnosis not present

## 2018-04-13 DIAGNOSIS — N390 Urinary tract infection, site not specified: Secondary | ICD-10-CM | POA: Diagnosis present

## 2018-04-13 DIAGNOSIS — F17210 Nicotine dependence, cigarettes, uncomplicated: Secondary | ICD-10-CM | POA: Diagnosis present

## 2018-04-13 DIAGNOSIS — F10231 Alcohol dependence with withdrawal delirium: Secondary | ICD-10-CM | POA: Diagnosis present

## 2018-04-13 DIAGNOSIS — J45909 Unspecified asthma, uncomplicated: Secondary | ICD-10-CM | POA: Diagnosis present

## 2018-04-13 DIAGNOSIS — K219 Gastro-esophageal reflux disease without esophagitis: Secondary | ICD-10-CM | POA: Diagnosis present

## 2018-04-13 DIAGNOSIS — G4700 Insomnia, unspecified: Secondary | ICD-10-CM | POA: Diagnosis present

## 2018-04-13 DIAGNOSIS — Z9889 Other specified postprocedural states: Secondary | ICD-10-CM

## 2018-04-13 DIAGNOSIS — Z8659 Personal history of other mental and behavioral disorders: Secondary | ICD-10-CM | POA: Diagnosis not present

## 2018-04-13 DIAGNOSIS — R740 Nonspecific elevation of levels of transaminase and lactic acid dehydrogenase [LDH]: Secondary | ICD-10-CM | POA: Diagnosis not present

## 2018-04-13 DIAGNOSIS — R7401 Elevation of levels of liver transaminase levels: Secondary | ICD-10-CM

## 2018-04-13 DIAGNOSIS — R45850 Homicidal ideations: Secondary | ICD-10-CM | POA: Diagnosis not present

## 2018-04-13 DIAGNOSIS — T1491XA Suicide attempt, initial encounter: Secondary | ICD-10-CM | POA: Diagnosis not present

## 2018-04-13 DIAGNOSIS — Z888 Allergy status to other drugs, medicaments and biological substances status: Secondary | ICD-10-CM | POA: Diagnosis not present

## 2018-04-13 DIAGNOSIS — T450X2A Poisoning by antiallergic and antiemetic drugs, intentional self-harm, initial encounter: Secondary | ICD-10-CM | POA: Diagnosis not present

## 2018-04-13 DIAGNOSIS — R590 Localized enlarged lymph nodes: Secondary | ICD-10-CM | POA: Diagnosis not present

## 2018-04-13 DIAGNOSIS — F10931 Alcohol use, unspecified with withdrawal delirium: Secondary | ICD-10-CM | POA: Diagnosis present

## 2018-04-13 DIAGNOSIS — Z79899 Other long term (current) drug therapy: Secondary | ICD-10-CM | POA: Diagnosis not present

## 2018-04-13 DIAGNOSIS — F332 Major depressive disorder, recurrent severe without psychotic features: Secondary | ICD-10-CM | POA: Diagnosis present

## 2018-04-13 DIAGNOSIS — Z915 Personal history of self-harm: Secondary | ICD-10-CM

## 2018-04-13 DIAGNOSIS — T438X2A Poisoning by other psychotropic drugs, intentional self-harm, initial encounter: Secondary | ICD-10-CM | POA: Diagnosis present

## 2018-04-13 DIAGNOSIS — K0889 Other specified disorders of teeth and supporting structures: Secondary | ICD-10-CM | POA: Diagnosis not present

## 2018-04-13 DIAGNOSIS — Z8669 Personal history of other diseases of the nervous system and sense organs: Secondary | ICD-10-CM | POA: Diagnosis not present

## 2018-04-13 HISTORY — DX: Anxiety disorder, unspecified: F41.9

## 2018-04-13 HISTORY — DX: Unspecified convulsions: R56.9

## 2018-04-13 HISTORY — DX: Major depressive disorder, single episode, unspecified: F32.9

## 2018-04-13 HISTORY — DX: Unspecified asthma, uncomplicated: J45.909

## 2018-04-13 HISTORY — DX: Depression, unspecified: F32.A

## 2018-04-13 HISTORY — DX: Gastro-esophageal reflux disease without esophagitis: K21.9

## 2018-04-13 LAB — CBC
HCT: 33.6 % — ABNORMAL LOW (ref 36.0–46.0)
Hemoglobin: 11.3 g/dL — ABNORMAL LOW (ref 12.0–15.0)
MCH: 33.9 pg (ref 26.0–34.0)
MCHC: 33.6 g/dL (ref 30.0–36.0)
MCV: 100.9 fL — ABNORMAL HIGH (ref 78.0–100.0)
Platelets: 89 10*3/uL — ABNORMAL LOW (ref 150–400)
RBC: 3.33 MIL/uL — ABNORMAL LOW (ref 3.87–5.11)
RDW: 13.5 % (ref 11.5–15.5)
WBC: 5.7 10*3/uL (ref 4.0–10.5)

## 2018-04-13 LAB — COMPREHENSIVE METABOLIC PANEL
ALT: 110 U/L — ABNORMAL HIGH (ref 14–54)
AST: 149 U/L — ABNORMAL HIGH (ref 15–41)
Albumin: 3.3 g/dL — ABNORMAL LOW (ref 3.5–5.0)
Alkaline Phosphatase: 45 U/L (ref 38–126)
Anion gap: 8 (ref 5–15)
BUN: <5 mg/dL — ABNORMAL LOW (ref 6–20)
CO2: 25 mmol/L (ref 22–32)
Calcium: 8.1 mg/dL — ABNORMAL LOW (ref 8.9–10.3)
Chloride: 105 mmol/L (ref 101–111)
Creatinine, Ser: 0.5 mg/dL (ref 0.44–1.00)
GFR calc Af Amer: >60 mL/min (ref >60)
GFR calc non Af Amer: >60 mL/min (ref >60)
Glucose, Bld: 109 mg/dL — ABNORMAL HIGH (ref 65–99)
Potassium: 3.1 mmol/L — ABNORMAL LOW (ref 3.5–5.1)
Sodium: 138 mmol/L (ref 135–145)
Total Bilirubin: 0.9 mg/dL (ref 0.3–1.2)
Total Protein: 6 g/dL — ABNORMAL LOW (ref 6.5–8.1)

## 2018-04-13 LAB — MAGNESIUM: Magnesium: 2.1 mg/dL (ref 1.7–2.4)

## 2018-04-13 LAB — HIV ANTIBODY (ROUTINE TESTING W REFLEX): HIV Screen 4th Generation wRfx: NONREACTIVE

## 2018-04-13 MED ORDER — ACETAMINOPHEN 325 MG PO TABS
650.0000 mg | ORAL_TABLET | Freq: Four times a day (QID) | ORAL | Status: DC | PRN
  Administered 2018-04-13 – 2018-04-18 (×4): 650 mg via ORAL
  Filled 2018-04-13 (×4): qty 2

## 2018-04-13 MED ORDER — LORAZEPAM 2 MG/ML IJ SOLN: 1.0000 mg | Freq: Four times a day (QID) | INTRAMUSCULAR | Status: DC | PRN

## 2018-04-13 MED ORDER — LORAZEPAM 1 MG PO TABS
1.0000 mg | ORAL_TABLET | Freq: Four times a day (QID) | ORAL | Status: DC | PRN
  Administered 2018-04-14: 1 mg via ORAL
  Filled 2018-04-13: qty 1

## 2018-04-13 MED ORDER — ACETAMINOPHEN 650 MG RE SUPP: 650.0000 mg | Freq: Four times a day (QID) | RECTAL | Status: DC | PRN

## 2018-04-13 MED ORDER — ENOXAPARIN SODIUM 40 MG/0.4ML ~~LOC~~ SOLN
40.0000 mg | SUBCUTANEOUS | Status: DC
  Administered 2018-04-13 – 2018-04-17 (×5): 40 mg via SUBCUTANEOUS
  Filled 2018-04-13 (×5): qty 0.4

## 2018-04-13 MED ORDER — POTASSIUM CHLORIDE CRYS ER 20 MEQ PO TBCR
20.0000 meq | EXTENDED_RELEASE_TABLET | Freq: Two times a day (BID) | ORAL | Status: DC
  Administered 2018-04-13: 20 meq via ORAL
  Filled 2018-04-13: qty 1

## 2018-04-13 MED ORDER — ZOLPIDEM TARTRATE 5 MG PO TABS: 5.0000 mg | ORAL_TABLET | Freq: Every evening | ORAL | Status: DC | PRN

## 2018-04-13 MED ORDER — TRAZODONE HCL 50 MG PO TABS
50.0000 mg | ORAL_TABLET | Freq: Every evening | ORAL | Status: DC | PRN
  Administered 2018-04-13: 50 mg via ORAL
  Filled 2018-04-13: qty 1

## 2018-04-13 NOTE — Progress Notes (Addendum)
Subjective:  Patient seen sitting comfortably on edge of bed this AM. Patient reports she wants discharge from hospital to home today and does not want to go back to behavior health facility. Patient reports that she has chronic sleep troubles and took possibly 2 trazodone to help her sleep leading up to admission. Has no idea what caused her seizure or why she went to behavior health.   Objective:  Vital signs in last 24 hours: Vitals:   04/13/18 0650 04/13/18 0700 04/13/18 0715 04/13/18 0806  BP: 133/89   (!) 122/91  Pulse: 83 78 87   Resp:  18 (!) 24   Temp:      TempSrc:      SpO2:  100% 98%   Weight:      Height:       Physical Exam  Constitutional: She appears well-developed and well-nourished. No distress.  HENT:  Poor dentition, chronic bilateral parotid gland swelling.   Eyes: Conjunctivae are normal. No scleral icterus.  Cardiovascular: Normal rate, regular rhythm and intact distal pulses. Exam reveals no friction rub.  No murmur heard. Respiratory: Effort normal. No respiratory distress. She has no wheezes. She has no rales.  GI: Soft. She exhibits no distension. There is no tenderness. There is no rebound.  Musculoskeletal: She exhibits no edema (of bilateral lower extremities) or tenderness (of bilateral lower extremities).  Skin: Skin is warm and dry. No rash noted. She is not diaphoretic. No erythema.  Psychiatric: She has a normal mood and affect. Her behavior is normal.   Assessment/Plan:  Principal Problem:   Delirium tremens (HCC) Active Problems:   MDD (major depressive disorder), recurrent severe, without psychosis (HCC)  Jennifer Compton is a 37 yo with PMH of major depression and alcohol use who presented for evaluation of witnessed seizure activity and altered mental status after abrupt cessation of alcohol use about 4 days prior to admission. She was admitted to the internal medicine teaching service for management. The specific problems addressed during  admission are as follows:  Acute alcohol withdrawal, delerium tremens: Patient without acute events overnight. Patient without seizure or further signs of withdrawal. Will continue CIWA monitoring for now. Patient medically stable once safe discharge plan in place.  -Admit to step-down -Continue CIWA monitoring -Continue seizure precautions, safety sitter for agitation -NS @ rate 125 ml/hr -Folic acid 1 mg, thiamin 100 mg, and multivitamin daily  Major depressive disorder, with recent ?intentional overdose: Patient recently admitted to behavioral health, after previous suicide attempt. Patient flat out denies suicidal attempt and states [facility] admission was voluntary and would like d/c to home today. Patient has very poor insight into her disease and has NO plans for concrete outpatient psychiatry follow up, as patient states her former psychiatrist no longer works at [employer]. I am concerned about this discharge plan after review of notes written by behavioral health personal on 04/11/18, who evaluated the patient after her overdose attept that brought her to [facility] for evaluation. These notes are very worrisome and outline that collateral information obtained from [facility] provider after the incident in question suggested a very different story than what the patient relayed to the primary team this morning. In those notes, mother reported long term history of self-injurious behavior and multiple prior suicidal attempts (including recent attempt in 01/2018 with overdose on benzodiazepines). Would prefer to have psychiatry reevaluate patient prior to discharge given this strong history of SI and concern for real SI attempt which precipitated behavioral health admission. Would  appreciate their input regarding patient safety and capacity to make decision for discharge without return to behavioral health.  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 No acute signs of decompensation this AM. Will need continuing follow up with PCP as outpatient regarding future workup.   Asymptomatic Pyuria: Patient's UA with nitrates, LE, and bacteria. Denies symptoms of dysuria, urinary urgency, urinary frequency. Given dose of ceftriaxone in ED, no other additional treatment needed given lack of symptoms.  FEN/GI: -Regular diet  -NS @ rate of 125 ml/hr, K+ 40 mEq repletion today  VTE Prophylaxis: Lovenox daily Code Status: Full  Dispo: Anticipated discharge pending appropriate plan.   Rozann Lesches, MD 04/13/2018, 1:13 PM Pager: [phone]  ADDENDUM: Spoke to providers at behavioral health this afternoon. Also obtained collateral from patient's mother who confirmed patient acted differently - sending "I love you" text to family prior to overdose and are concerned overdose was real suicide attempt, in spite of what patient said this morning on rounds. Behavioral health requires reevaluation by psychiatrist prior to transfer back to their facility. They do, however, feel after chart review and brief discussion with note Clinical research associate, that the patient meets involuntary commitment criteria, as she represents a danger to herself. Since patient is willing to stay in hospital for formal evaluation with inpatient psychiatrist tomorrow, will not pursue this avenue right now. Will pursue IVC if patient threatens to leave AMA prior to formal psychiatrist review.

## 2018-04-13 NOTE — Progress Notes (Signed)
Pt is hallucinating, hearing her mom singing, seeing something like spiders that has long legs that strangled her.  Her iv looked like a spider and she pulled it out.  She said her room turned into her mom's room and she heard her mom's laughter. Hospitalist is at bedside now.

## 2018-04-13 NOTE — Progress Notes (Signed)
  Date: 04/13/2018  Patient name: Jennifer Compton  Medical record number: [id number]  Date of birth: [birth year]   I have seen and evaluated this patient and I have discussed the plan of care with the house staff. Please see their note for complete details. I concur with their findings with the following additions/corrections:   Ms. Ryce is a 37 year old woman with alcohol use disorder, depression, and possible borderline personality disorder who was admitted from behavioral health Hospital for a witnessed seizure.  She had another seizure in the emergency department here at [facility].  She admits to heavy alcohol use prior to her admission to behavioral health for a possible suicide attempt by overdose.  Although she initially denied prior history of alcohol withdrawal seizures, her mom was able to confirm for [place] today that she has had prior alcohol withdrawal seizures when she has stopped drinking.  During her initial care in the ED, she required repetitive dosing of benzodiazepines followed by dexmedetomidine.  After dose of Librium, she seemed to calm down and required very little subsequent benzodiazepines.  Today on examination, she was calm and pleasant without any signs of active alcohol withdrawal.  As documented by Dr. Saunders Revel, she appears to be through the worst of her alcohol withdrawal and is medically cleared for transfer to psychiatry.  However, she did not want to return to behavioral health.  We gave her the option of awaiting repeat psychiatric evaluation here or returning to behavioral health to complete her treatment there, and she opted to remain here for psychiatric evaluation.  Collateral history from her mother and does make her possible suicide attempt story more concerning, as her mother is genuinely concerned for her safety and described the patient as trying to hide after intentionally overdosing, suggesting she was trying not to be found to abort the attempt.  At this  time, she can not leave the hospital AMA, and should she try to leave, she will need to undergo IVC until complete evaluation by psychiatry.  Jessy Oto, M.D., Ph.D. 04/13/2018, 10:08 PM

## 2018-04-13 NOTE — Progress Notes (Signed)
Iran Ouch MD was paged and asked if she wants to prescibe meds for ETOH, benzo withdrawal or seizure meds.   Dr. Delma Officer said to call her back if anything changes, no new orders given.

## 2018-04-13 NOTE — Plan of Care (Signed)
Pt is calm, took a shower.

## 2018-04-13 NOTE — Progress Notes (Signed)
Was paged by nurse stating that the patient wants to leave hospital and is hallucinating. Nurse mentioned that she had seen the patient walk out into hallway and express that she wanted to leave. Nurse was able to bring patient back into room.   Went to bedside and patient was standing in the middle of her room. The patient mentioned that a few minutes ago she had seen her mother's room and heard her mothers voice. The patient expressed that she knew that this was a hospital room and was nervous about why she was hearing and seeing things that weren't present. Denied palpitations, sob, or chest pain. The patient was oriented to person, place and time. On exam, the patient had normal pulse and breaths. She was not tremulous, did not have any chills.   Informed patient that psychiatry will come to evaluate her tomorrow 5/30. She agreed to stay in hospital overnight.   -Will continue to monitor patient. If patient hallucinates again please page IMTS again and we will come to bedside to re-evaluate and give anxiolytic if needed.   Lorenso Courier, MD Internal Medicine PGY1 [phone] 04/13/2018, 7:05 PM

## 2018-04-14 DIAGNOSIS — T43212A Poisoning by selective serotonin and norepinephrine reuptake inhibitors, intentional self-harm, initial encounter: Secondary | ICD-10-CM

## 2018-04-14 DIAGNOSIS — G4700 Insomnia, unspecified: Secondary | ICD-10-CM

## 2018-04-14 DIAGNOSIS — Z818 Family history of other mental and behavioral disorders: Secondary | ICD-10-CM

## 2018-04-14 DIAGNOSIS — T1491XA Suicide attempt, initial encounter: Secondary | ICD-10-CM

## 2018-04-14 DIAGNOSIS — F17210 Nicotine dependence, cigarettes, uncomplicated: Secondary | ICD-10-CM

## 2018-04-14 DIAGNOSIS — T450X2A Poisoning by antiallergic and antiemetic drugs, intentional self-harm, initial encounter: Secondary | ICD-10-CM

## 2018-04-14 LAB — BASIC METABOLIC PANEL
Anion gap: 7 (ref 5–15)
BUN: <5 mg/dL — ABNORMAL LOW (ref 6–20)
CO2: 23 mmol/L (ref 22–32)
Calcium: 9.1 mg/dL (ref 8.9–10.3)
Chloride: 108 mmol/L (ref 101–111)
Creatinine, Ser: 0.42 mg/dL — ABNORMAL LOW (ref 0.44–1.00)
GFR calc Af Amer: >60 mL/min (ref >60)
GFR calc non Af Amer: >60 mL/min (ref >60)
Glucose, Bld: 91 mg/dL (ref 65–99)
Potassium: 3 mmol/L — ABNORMAL LOW (ref 3.5–5.1)
Sodium: 138 mmol/L (ref 135–145)

## 2018-04-14 MED ORDER — POTASSIUM CHLORIDE CRYS ER 20 MEQ PO TBCR
20.0000 meq | EXTENDED_RELEASE_TABLET | Freq: Two times a day (BID) | ORAL | Status: DC
  Administered 2018-04-14 – 2018-04-15 (×3): 20 meq via ORAL
  Filled 2018-04-14 (×3): qty 1

## 2018-04-14 MED ORDER — HALOPERIDOL LACTATE 5 MG/ML IJ SOLN
1.0000 mg | Freq: Four times a day (QID) | INTRAMUSCULAR | Status: DC | PRN
  Filled 2018-04-14: qty 1

## 2018-04-14 MED ORDER — LORAZEPAM 2 MG/ML IJ SOLN
INTRAMUSCULAR | Status: AC
  Filled 2018-04-14: qty 1

## 2018-04-14 MED ORDER — LORAZEPAM 2 MG/ML IJ SOLN
0.5000 mg | Freq: Once | INTRAMUSCULAR | Status: AC
  Administered 2018-04-14: 0.5 mg via INTRAVENOUS
  Filled 2018-04-14: qty 1

## 2018-04-14 MED ORDER — LORAZEPAM 2 MG/ML IJ SOLN
1.0000 mg | INTRAMUSCULAR | Status: DC | PRN
  Administered 2018-04-14: 1 mg via INTRAVENOUS
  Filled 2018-04-14: qty 1

## 2018-04-14 MED ORDER — ZIPRASIDONE MESYLATE 20 MG IM SOLR
20.0000 mg | Freq: Every day | INTRAMUSCULAR | Status: DC | PRN
  Filled 2018-04-14: qty 20

## 2018-04-14 MED ORDER — HALOPERIDOL LACTATE 5 MG/ML IJ SOLN
1.0000 mg | Freq: Once | INTRAMUSCULAR | Status: AC
  Administered 2018-04-14: 1 mg via INTRAVENOUS
  Filled 2018-04-14: qty 1

## 2018-04-14 MED ORDER — NICOTINE 21 MG/24HR TD PT24
21.0000 mg | MEDICATED_PATCH | Freq: Every day | TRANSDERMAL | Status: DC
  Administered 2018-04-14 – 2018-04-18 (×5): 21 mg via TRANSDERMAL
  Filled 2018-04-14 (×7): qty 1

## 2018-04-14 NOTE — Progress Notes (Signed)
Patient very agitated this morning, attempting to leave the unit, and screaming throughout hallways.  MD and security notified and at bedside.  Ativan and haldol ordered and administered.  Patient continued to be agitated, having visual and auditory hallucinations of her mother, kicking at staff, attempting to leave, and pulling at IV.  Patient placed in 4 point soft restraints and waist belt.  PRN ativan orders received and administered.  Will continue to monitor.

## 2018-04-14 NOTE — Progress Notes (Signed)
Pt. restless/ hallucinating- hearing her mother's and boyfriend's voices in the hall. Pt. loud and wants to go in the hall; doesn't think she's at the hospital; trying to pull room door open; sitter and RN with pt.; pt. Combative.  Dr. Alinda Money paged and informed; pt. refusing to take anymore Ativan.; security called also. Dr. Alinda Money and Dr. Samuella Cota in to see pt.

## 2018-04-14 NOTE — Discharge Summary (Signed)
Name: Jennifer Compton MRN: [id number] DOB: 1981/09/08 37 y.o. PCP: Selina Cooley, MD  Date of Admission: 04/13/2018  5:22 PM Date of Discharge: 04/18/18 Attending Physician: Cephas Darby  Discharge Diagnosis: Principal Problem:   Suicide attempt by other psychotropic drug overdose [facility]) Active Problems:   MDD (major depressive disorder), recurrent severe, without psychosis (HCC)   Alcohol withdrawal seizure with delirium (HCC)   Transaminitis   Thrombocytopenia (HCC)  Discharge Medications: Allergies as of 04/18/2018      Reactions   Codeine Nausea And Vomiting      Medication List    STOP taking these medications   acetaminophen 325 MG tablet Commonly known as:  TYLENOL   ALPRAZolam 1 MG tablet Commonly known as:  XANAX   amphetamine-dextroamphetamine 10 MG 24 hr capsule Commonly known as:  ADDERALL XR   clonazePAM 1 MG tablet Commonly known as:  KLONOPIN   traZODone 50 MG tablet Commonly known as:  DESYREL     TAKE these medications   folic acid 1 MG tablet Commonly known as:  FOLVITE Take 1 tablet (1 mg total) by mouth daily.   gabapentin 600 MG tablet Commonly known as:  NEURONTIN Take 0.5 tablets (300 mg total) by mouth 3 (three) times daily.   multivitamin with minerals Tabs tablet Take 1 tablet by mouth daily.   ramelteon 8 MG tablet Commonly known as:  ROZEREM Take 1 tablet (8 mg total) by mouth at bedtime as needed (insomnia).   thiamine 100 MG tablet Take 1 tablet (100 mg total) by mouth daily.       Disposition and follow-up:   [name] was discharged from [facility] in Stable condition.  At the hospital follow up visit please address:  1.   Patient admitted for care of alcohol withdrawal seizure in the setting of abrupt alcohol cessation during recent hospitalization for medical/psychiatric care after recent intentional overdose/suicide attempt.   -Please help enroll pt in outpatient drug abuse therapy, AA,  ensure close follow up with a psychiatrist at [facility] -Consider naltrexone if patient wants medication assistance with alcohol cessation.   Patient incidentally noted to have transaminase levels with elevated bilirubin and a mild thrombocytopenia on admission, suggesting liver damage from 20 years of alcohol abuse. No ascites were noted.  -will need further imaging and workup  -again stressing alcohol cessation  2.  Labs / imaging needed at time of follow-up: CMP, liver ultrasound  3.  Pending labs/ test needing follow-up: None  Follow-up Appointments: Follow-up Information    [facility]. Go to.   Specialty:  Behavioral Health Why:  Walk in for first appointment.  Contact information: [address] [place] [address] [phone]        [facility]. Go to.   Specialty:  Professional Counselor Why:  Walk in for first appointment.  Contact information: [address] [place] [address] [phone]           Hospital Course by problem list: Principal Problem:   Suicide attempt by other psychotropic drug overdose (HCC) Active Problems:   MDD (major depressive disorder), recurrent severe, without psychosis (HCC)   Alcohol withdrawal seizure with delirium (HCC)   Transaminitis   Thrombocytopenia (HCC)   Jennifer Compton is a 37 yo with PMH of major depression and alcohol use who presented for evaluation of witnessed seizure activity and altered mental status after abrupt cessation of alcohol use about 4 days prior to admission. She was admitted to the  internal medicine teaching service for management. The specific problems addressed during admission are as follows:  Acute alcohol withdrawal seizure with delirium:  Per family report, patient has history of alcohol withdrawal seizures and experienced her last seizure about 1 year ago at unspecified facility while undergoing treatment for long-standing alcohol use. Patient's family reported  daily alcohol use since age 48 with intermittent periods of breif sobriety whenever patient presents to a health care system for detoxification. Upon admission patient endorsed drinking at least 12 cans of natural ice beer daily (per chart review, 24 ounce cans) and that her last drink was the evening when she presented to [facility] for care after a suicide attempt by drug overdose (trazodone, benadryl). She was undergoing transfer from [place] to behavioral health facility for treatment of depression/suicidal ideation when she developed ?hallucinations and seizure. She was transferred from behavioral health hospital to [facility] for further management. She had additional seizure episode witnessed by ED providers in ED and was loaded with Keppra 1000 mg. Patient's hallucinations and agitation in ED initially required a Precedex drip, however this was discontinued upon admission to hospital given patient's significant clinical improvement with this intervention. Her initial seizure activity was attributed to alcohol withdrawal given her history of alcohol withdrawal seizures, timing of this event in relation to alcohol cessation, and the absence of infectious, metabolic, or structural abnormalities to explain new seizure activity on initial ED workup which included a head CT and lab work as shown below. Upon admission to the hospital the patient was given librium 25 mg and maintained on CIWA monitoring with Ativan. The patient's agitation, mental status, and alcohol withdrawal symptoms significantly improved overnight on PRN Ativan and patient was medically stable for discharge on hospital day #1. At this time the patient was alert, oriented x3, not experiencing hallucinations, and had no additional seizure activity.   Major depressive disorder, with intentional overdose with Trazodone, Benadryl in 03/2108: As discussed above prior to arrival to [facility]  patient undergoing voluntary commitement at  behavioral health hospital for recent intentional overdose. Collateral obtained from patient's mother after admission reaffirmed that patient's recent overdose was intentional and consistent with behavior exhibited around previous suicide attempts. Per mother report prior to most recent OD with Klonopin in 03/21019, patient had attention seeking behavior that included calling friends/family prior to taking pills, which was exhibited during the OD with Trazodone, benadryl, and alcohol which precipitated admission to OSH in 03/2018. During hospitalization patient refused that recent OD was suicide attempt and provided very different story to staff. Patient without current psychiatric care and/or current medications other than PRN benzodiazepenes, which were discontinued upon discharge given OD attempt with Klonopin two months prior to admission. The patient became physically and verbally abusive towards staff upon arrival to the floor from the ED once she was clinically stable from acute alcohol withdrawal seizure. In her attempts to elope from the floor after arrival, patient threatened overnight nurse, stating "I will kill you" when she walked by her in the hall way. She was also verbally and physically threatening to MD on rounds during interview, stating she was "going to lose it" if I didn't let her leave the hospital. During this time patient was also experiencing new auditory hallucinations not present during the  Psychiatry evaluation obtained later in the day suggested involuntary commitment for treatment of suicidal/homicidal ideation, as the patient was not amenable to voluntary commitment. Paperwork for 7 day involuntary commitment was completed on 04/14/2018 and patient was  set up for transfer to behavioral health facility.  The next few days however the patient's symptoms began to subside.  She no longer required PRN ativan for agitation and anxiety.  She conversed normally, was not as depressed or  hopeless.  She was evaluated by psychiatry and felt that she should be discharged as she was no longer suicidal or homicidal and was more at her baseline.  She was encouraged to obtain her treatment for alcohol abuse and depression in the outpatient setting.    Transaminitis: Patient's LFT's elevated on admission with increased T. Bili and thrombocytopenia suggestive of cirrhosis. Patient's INR within normal limits, suggesting normal liver synthetic function. Patient does not have current diagnosis of cirrhosis and no imaging is available per chart review. This diagnosis would not be surprising given heavy alcohol use history. Recommend repeat CMP and further liver imaging to evaluate for possible underlying cirrhosis as an outpatient.   Asymptomatic Pyuria: Patient's UA with nitrates, LE, and bacteria. Patient denies dysuria, increased urinary frequency, difficulty voiding, and fevers. Patient received dose of ceftriaxone in ED for treatment of possible UTI when history unable to be performed 2/2 altered mental status, but was discontinued on HD #1 when patient denied symptoms of urinary tract infection at present and leading up to admission.   Discharge Vitals:   BP 108/84 (BP Location: Left Arm)   Pulse 69   Temp 98.1 F (36.7 C) (Oral)   Resp 16   SpO2 100%   Pertinent Labs, Studies, and Procedures:   CMP Latest Ref Rng & Units 04/16/2018 04/14/2018 04/13/2018  Glucose 65 - 99 mg/dL 87 91 161(W)  BUN 6 - 20 mg/dL <9(U) <0(A) <5(W)  Creatinine 0.44 - 1.00 mg/dL 0.98 1.19(J) 4.78  Sodium 135 - 145 mmol/L 139 138 138  Potassium 3.5 - 5.1 mmol/L 3.3(L) 3.0(L) 3.1(L)  Chloride 101 - 111 mmol/L 104 108 105  CO2 22 - 32 mmol/L Calcium 8.9 - 10.3 mg/dL 9.0 9.1 2.9(F)  Total Protein 6.5 - 8.1 g/dL 6.9 - 6.0(L)  Total Bilirubin 0.3 - 1.2 mg/dL 6.2(Z) - 0.9  Alkaline Phos 38 - 126 U/L 40 - 45  AST 15 - 41 U/L 170(H) - 149(H)  ALT 14 - 54 U/L 187(H) - 110(H)   CBC Latest Ref Rng & Units  04/13/2018 04/12/2018  WBC 4.0 - 10.5 K/uL 5.7 5.6  Hemoglobin 12.0 - 15.0 g/dL 11.3(L) 11.9(L)  Hematocrit 36.0 - 46.0 % 33.6(L) 34.9(L)  Platelets 150 - 400 K/uL 89(L) 80(L)   Urinalysis, 04/11/2018    Component Value Date/Time   COLORURINE AMBER (A) 04/11/2018 2358   APPEARANCEUR HAZY (A) 04/11/2018 2358   LABSPEC 1.012 04/11/2018 2358   PHURINE 8.0 04/11/2018 2358   GLUCOSEU NEGATIVE 04/11/2018 2358   HGBUR NEGATIVE 04/11/2018 2358   BILIRUBINUR NEGATIVE 04/11/2018 2358   KETONESUR 5 (A) 04/11/2018 2358   PROTEINUR NEGATIVE 04/11/2018 2358   NITRITE POSITIVE (A) 04/11/2018 2358   LEUKOCYTESUR SMALL (A) 04/11/2018 2358   Drugs of Abuse, 04/11/2018    Component Value Date/Time   LABOPIA NONE DETECTED 04/11/2018 2358   COCAINSCRNUR NONE DETECTED 04/11/2018 2358   LABBENZ POSITIVE (A) 04/11/2018 2358   AMPHETMU NONE DETECTED 04/11/2018 2358   THCU NONE DETECTED 04/11/2018 2358   LABBARB NONE DETECTED 04/11/2018 2358     Discharge Instructions: Discharge Instructions    Call MD for:  extreme fatigue   Complete by:  As directed    Call MD for:  persistant dizziness or light-headedness   Complete by:  As directed    Call MD for:  persistant dizziness or light-headedness   Complete by:  As directed    Call MD for:  persistant nausea and vomiting   Complete by:  As directed    Call MD for:  temperature >100.4   Complete by:  As directed    Diet - low sodium heart healthy   Complete by:  As directed    Diet - low sodium heart healthy   Complete by:  As directed    Discharge instructions   Complete by:  As directed    Please follow up with your primary care physician and psychiatrist within 1 week of discharge.   Increase activity slowly   Complete by:  As directed    Increase activity slowly   Complete by:  As directed       Signed: Angelita Ingles, MD 04/19/2018, 6:00 PM

## 2018-04-14 NOTE — Progress Notes (Signed)
Paged IM- Dr. Alinda Money re: CIWA and med for restlessness, and pt. having hallucinations; orders given.

## 2018-04-14 NOTE — Plan of Care (Signed)
  Problem: Education: Goal: Knowledge of General Education information will improve Outcome: Progressing Note:  POC reviewed with pt.; continue reorient pt. that she's in the hospital and not at home.

## 2018-04-14 NOTE — Progress Notes (Signed)
Patient has been served. IVC good for 7 days. [facility] will contact CSW when bed available.   Osborne Casco Madelina Sanda LCSW [phone]

## 2018-04-14 NOTE — Progress Notes (Signed)
CSW consulted to complete IVC paperwork due to patient threatening staff. CSW working on paperwork.   Osborne Casco Souleymane Saiki LCSW [phone]

## 2018-04-14 NOTE — Progress Notes (Signed)
Subjective:  Patient incredibly aggressive and agitated this AM, with multiple attempts to elope from floor, physically harm staff members, and yelling disruptive, profane, threatening things to staff for >1.5 hours. During one episode of elopement the patient pointed at a nurse in a patient's room stating that she wanted to kill her for being mean to her overnight (see A/P below).   Patient also intermittently disoriented this AM stating that she needs to leave because she hears her parent's outside the hospital and thinks they are getting married.   Objective:  Vital signs in last 24 hours: Vitals:   04/13/18 2053 04/13/18 2054 04/14/18 0432  BP: (!) 138/102 (!) 139/109 (!) 141/111  Pulse: 78 78 86  Resp: 16  16  Temp: (!) 97.5 F (36.4 C)  98.4 F (36.9 C)  TempSrc: Oral  Oral  SpO2: 100%  100%   Physical Exam  Constitutional:  Appears stated age. Non diaphoretic, no acute distress  HENT:  Mouth/Throat: Oropharynx is clear and moist.  Chronic poor dentition and bilateral parotid swelling, stable from previous exams  Eyes: Conjunctivae are normal. No scleral icterus.  Cardiovascular: Normal rate and regular rhythm. Exam reveals no friction rub.  No murmur heard. Respiratory: Effort normal and breath sounds normal.  Musculoskeletal: She exhibits no edema (of bilateral lower extremities) or tenderness (of bilateral lower extremities).  Skin: Skin is warm and dry. No rash noted. No erythema.  Psychiatric:  Initially calm during interview. Became agitated and physically aggressive/threatening when told plan of care. Patient endorsing auditory hallucinations with poor overall judgment and loose associations in thought content. Active HI this AM.    Assessment/Plan:  Principal Problem:   Alcohol withdrawal seizure with delirium (HCC) Active Problems:   MDD (major depressive disorder), recurrent severe, without psychosis (HCC)   Suicide attempt by other psychotropic drug  overdose (HCC)  Jennifer Compton is a 37 yo with PMH of major depression and alcohol use who presented for evaluation of witnessed seizure activity and altered mental status after abrupt cessation of alcohol use about 4 days prior to admission. She was admitted to the internal medicine teaching service for management. The specific problems addressed during admission are as follows:  Acute alcohol withdrawal, delerium tremens: Patient through acute alcohol withdrawal and without evidence of recurrent seizure overnight. Patient medically stable for discharge. -Med-Surg -Continue CIWA monitoring -Continue seizure precautions, safety sitter for agitation -Folic acid 1 mg, thiamin 100 mg, and multivitamin daily  Major depressive disorder, with recent intentional overdose: Collateral obtained from patient's mother yesterday suggested patient's recent overdose consistent with previous suicide attempts. Overnight patient became confused and disoriented. She has been increasingly agitated and physically aggressive with staff throughout the morning. While attempting to elope this AM, MD overhead patient stating "I will kill you" when she walked by patient's room. When asked who she was talking about patient laughed and indicated the nurse in the patient's room was who she directed the comment to and that she said that because the nurses "have been mean" all night. Patient verbally and physically threatening to MD on rounds when I was interviewing her with safety sitter bedside, stating that she was "going to loose it" if I didn't let her leave the hospital. She made threatening comments and gestures to this MD when I said she couldn't leave hospital without psychiatrist evaluation. At this point patient is medically stable for discharge and requires IVC, given recent suicide attempt and new, aggressive behavior and homicidal ideation directed at staff  this AM. Will transfer to behavioral health for further management  of HI and recent SI. Will touch base with Dr. Sharma Covert after the events of this AM to expedite this process. Will need to order safety restraints as patient has been physically abusive to staff and threatening this AM. Patient has also required multi-person assist to administer PRN medications for agitation - if patient' continues will give IM Geodon after discussion with Dr. Sharma Covert this AM.  -Safety sitter bedside -SW consulted for IVC -Requesting transfer to behavioral health for further management -Psychiatry consulted, recommendations appreciated -Continue safety restraints, mittens -PRN haldol and ativan as needed -IM Geodon for restraint if she continues to be physically threatening in spite of above measures  Transaminitis: Patient's LFT's elevated this AM, likely has alcoholic cirrhosis. No acute signs of decompensation this AM. Will need continuing follow up with PCP as outpatient regarding future workup.   FEN/GI: -Regular diet  -No IVF  VTE Prophylaxis:Lovenox daily Code Status: Full  Dispo: Anticipated transfer to behavioral health after IVC paperwork.   Jennifer Lesches, MD 04/14/2018, 9:17 AM Pager: [phone]

## 2018-04-14 NOTE — Consult Note (Addendum)
[facility] Face-to-Face Psychiatry Consult   Reason for Consult:  Suicide attempt  Referring Physician:  Dr. Rebeca Alert  Patient Identification: Jennifer Compton MRN:  [id number] Principal Diagnosis: Suicide attempt by other psychotropic drug overdose [facility]) Diagnosis:   Patient Active Problem List   Diagnosis Date Noted  . Alcohol withdrawal seizure with delirium ([place]) [F10.231] 04/13/2018  . Suicide attempt by other psychotropic drug overdose ([place]) [T43.8X2A] 04/13/2018  . Delirium tremens ([place]) [F10.231] 04/12/2018  . MDD (major depressive disorder), recurrent severe, without psychosis ([place]) [F33.2] 04/11/2018    Total Time spent with patient: 1 hour  Subjective:   Jennifer Compton is a 37 y.o. female patient admitted with alcohol withdrawal seizure from [facility].  HPI:  Per chart review, patient was overdosed on Trazodone (#8) and Benadryl (#2) prior to admission to [facility] ED on 5/27. Her boyfriend was unable to wake her and she reportedly defecated on herself and was choking on her vomit. Her boyfriend called 911.  Her mother reports that around 2:30 pm that she had sent her a text messaged that stated, "Love you mom" which is atypical behavior for her. She has attempted suicide 5 times. She last overdosed on Klonopin (#10-20) in March 2019 and was sent to rehab following hospitalization. In the past, she has called or texted someone to inform them after she attempted suicide. She started drinking alcohol and cutting around 37 y/o when her parents divorced. Alcohol use increased around 37 y/o. She has been to rehab 7 or 8 times. She has a history of alcohol withdrawal seizures. She has a history of bulimia. She was admitted to the medical floor yesterday after having a seizure. She had another seizure while in the ED. She has been agitated and aggressive to staff since admission. She endorsed AVH. She attempted to elope twice so she was IVC'd. She appears to minimize her overdose and denies that it was an  attempt but she wanted to sleep. She does not want to return to [facility]. She received Haldol 1 mg and a total of 2.5 mg of Ativan this morning for agitation. She received Trazodone 50 mg overnight. Home medications include Trazodone 50 mg qhs PRN, Klonopin 1 mg BID PRN, Xanax 1 mg daily PRN and Adderall XR 10 mg daily. PMP indicates that she last filled these medications on 3/20. They are prescribed by Dr. Dineen Kid. UDS was positive for benzodiazepines on admission.   On interview, Jennifer Compton reports that she is prescribed her medications by her PCP, Dr. Redmond Pulling.  She reports that she has an emergency supply of Xanax.  She receives 10 pills every month.  She also takes Klonopin 1 mg qhs.  She reports rare use of Adderall.  She reports that her mood is generally okay but she has some days where she stays in bed up to 3 days due to lethargy and depressed mood.  She denies that her recent ingestion of Trazodone was a suicide attempt.  She reports, "I just have periods where I want to drown out the world and sleep for a long time."  Her boyfriend found her at home after he got off of work and called 911.  Current stressors include determining where she will live, financial stressors and unemployment.  She reports no problems with sleep or appetite.  She denies a history of manic symptoms (decreased need for sleep, pressured speech or increased energy).  She denies current SI, HI or AVH.  Past Psychiatric History: Anxiety, depression and alcohol abuse.  Risk to Self: Yes given recent suicide attempt.  Risk to Others:  None. Denies HI.  Prior Inpatient Therapy:  Denies  Prior Outpatient Therapy:   Her medications are managed by her PCP, Dr. Redmond Pulling.   Past Medical History:  Past Medical History:  Diagnosis Date  . Anxiety   . Asthma   . Depression   . GERD (gastroesophageal reflux disease)   . Seizures ([place]) 04/13/2018    Past Surgical History:  Procedure Laterality Date  . TONSILLECTOMY     Family  History: History reviewed. No pertinent family history. Family Psychiatric  History: Maternal grandmother-depression and anxiety and mother-anxiety.   Social History:  Social History   Substance and Sexual Activity  Alcohol Use Yes  . Alcohol/week: 67.2 oz  . Types: 112 Cans of beer per week   Comment: 04/13/2018 "12, 160z beers/day"     Social History   Substance and Sexual Activity  Drug Use Not Currently    Social History   Socioeconomic History  . Marital status: Single    Spouse name: Not on file  . Number of children: Not on file  . Years of education: Not on file  . Highest education level: Not on file  Occupational History  . Not on file  Social Needs  . Financial resource strain: Not on file  . Food insecurity:    Worry: Not on file    Inability: Not on file  . Transportation needs:    Medical: Not on file    Non-medical: Not on file  Tobacco Use  . Smoking status: Current Every Day Smoker    Packs/day: 0.50    Years: 20.00    Pack years: 10.00    Types: Cigarettes  . Smokeless tobacco: Never Used  Substance and Sexual Activity  . Alcohol use: Yes    Alcohol/week: 67.2 oz    Types: 112 Cans of beer per week    Comment: 04/13/2018 "12, 160z beers/day"  . Drug use: Not Currently  . Sexual activity: Not Currently  Lifestyle  . Physical activity:    Days per week: Not on file    Minutes per session: Not on file  . Stress: Not on file  Relationships  . Social connections:    Talks on phone: Not on file    Gets together: Not on file    Attends religious service: Not on file    Active member of club or organization: Not on file    Attends meetings of clubs or organizations: Not on file    Relationship status: Not on file  Other Topics Concern  . Not on file  Social History Narrative  . Not on file   Additional Social History: She is unemployed. She drinks up to 12 beers daily for the past 5 years. She has not completed rehab in several years. She  denies illicit substance use.     Allergies:   Allergies  Allergen Reactions  . Codeine Nausea And Vomiting    Labs:  Results for orders placed or performed during the hospital encounter of 04/13/18 (from the past 48 hour(s))  Basic metabolic panel     Status: Abnormal   Collection Time: 04/14/18  6:41 AM  Result Value Ref Range   Sodium 138 135 - 145 mmol/L   Potassium 3.0 (L) 3.5 - 5.1 mmol/L   Chloride 108 101 - 111 mmol/L   CO2 23 22 - 32 mmol/L   Glucose, Bld 91 65 - 99 mg/dL  BUN <5 (L) 6 - 20 mg/dL   Creatinine, Ser 0.42 (L) 0.44 - 1.00 mg/dL   Calcium 9.1 8.9 - 10.3 mg/dL   GFR calc non Af Amer >60 >60 mL/min   GFR calc Af Amer >60 >60 mL/min    Comment: (NOTE) The eGFR has been calculated using the CKD EPI equation. This calculation has not been validated in all clinical situations. eGFR's persistently <60 mL/min signify possible Chronic Kidney Disease.    Anion gap 7 5 - 15    Comment: Performed at [facility] [address] [place], [place] [address]    Current Facility-Administered Medications  Medication Dose Route Frequency Provider Last Rate Last Dose  . acetaminophen (TYLENOL) tablet 650 mg  650 mg Oral Q6H PRN Nedrud, Larena Glassman, MD   650 mg at 04/13/18 2130   Or  . acetaminophen (TYLENOL) suppository 650 mg  650 mg Rectal Q6H PRN Nedrud, Larena Glassman, MD      . enoxaparin (LOVENOX) injection 40 mg  40 mg Subcutaneous Q24H Nedrud, Marybeth, MD   40 mg at 04/13/18 1936  . haloperidol lactate (HALDOL) injection 1 mg  1 mg Intravenous Q6H PRN Lacroce, Hulen Shouts, MD      . LORazepam (ATIVAN) 2 MG/ML injection           . LORazepam (ATIVAN) 2 MG/ML injection           . LORazepam (ATIVAN) injection 1 mg  1 mg Intravenous Q4H PRN Melanee Spry, MD   1 mg at 04/14/18 0923  . nicotine (NICODERM CQ - dosed in mg/24 hours) patch 21 mg  21 mg Transdermal Daily Alphonzo Grieve, MD   21 mg at 04/14/18 0504  . traZODone (DESYREL) tablet 50 mg  50 mg Oral QHS  PRN Thomasene Ripple, MD   50 mg at 04/13/18 2333  . ziprasidone (GEODON) injection 20 mg  20 mg Intramuscular Daily PRN Thomasene Ripple, MD        Musculoskeletal: Strength & Muscle Tone: within normal limits Gait & Station: normal Patient leans: N/A  Psychiatric Specialty Exam: Physical Exam  Nursing note and vitals reviewed. Constitutional: She is oriented to person, place, and time. She appears well-developed and well-nourished.  HENT:  Head: Normocephalic and atraumatic.  Neck: Normal range of motion.  Respiratory: Effort normal.  Musculoskeletal: Normal range of motion.  Neurological: She is alert and oriented to person, place, and time.  Skin: No rash noted.  Psychiatric: She has a normal mood and affect. Her speech is normal and behavior is normal. Thought content normal. Cognition and memory are normal. She expresses impulsivity.    Review of Systems  Constitutional: Negative for chills and fever.  Cardiovascular: Negative for chest pain.  Gastrointestinal: Negative for abdominal pain, constipation, diarrhea, nausea and vomiting.  Psychiatric/Behavioral: Positive for depression and substance abuse. Negative for hallucinations and suicidal ideas. The patient does not have insomnia.   All other systems reviewed and are negative.   Blood pressure (!) 134/101, pulse (!) 103, temperature 98.3 F (36.8 C), temperature source Oral, resp. rate 18, SpO2 100 %.There is no height or weight on file to calculate BMI.  General Appearance: Fairly Groomed, young, Caucasian female, wearing paper hospital scrubs with short, blond hair who is sitting on her bed. NAD.   Eye Contact:  Good  Speech:  Clear and Coherent and Normal Rate  Volume:  Normal  Mood:  "Okay"  Affect:  Congruent  Thought Process:  Goal Directed, Linear and  Descriptions of Associations: Intact  Orientation:  Full (Time, Place, and Person)  Thought Content:  Logical  Suicidal Thoughts:  No  Homicidal Thoughts:  No   Memory:  Immediate;   Good Recent;   Good Remote;   Good  Judgement:  Poor  Insight:  Poor  Psychomotor Activity:  Normal  Concentration:  Concentration: Good and Attention Span: Good  Recall:  Good  Fund of Knowledge:  Good  Language:  Good  Akathisia:  No  Handed:  Right  AIMS (if indicated):   N/A  Assets:  Communication Skills Housing Intimacy Social Support  ADL's:  Intact  Cognition:  She appears to have difficulty comprehending the need for hospitalization.   Sleep:   Okay   Assessment:  Jennifer Compton is a 37 y.o. female who was admitted with alcohol withdrawal seizure from [facility]. [facility] Trazodone and Benadryl overdose. She minimizes her overdose and lacks insight about the seriousness of her condition. She warrants inpatient psychiatric hospitalization due to recent suicide attempt in the setting of multiple stressors and depressive symptoms.   Treatment Plan Summary: -Patient warrants inpatient psychiatric hospitalization given high risk of harm to self. -Continue bedside sitter.  -Would not restart Klonopin or Xanax given concurrent alcohol use and risk for death with overdose. Continue CIWA protocol to monitor for withdrawal.  -Would not restart Adderall at this time and patient reports rare use of medication and prescriptions were last filled on 3/20. -Provide Melatonin 3-6 mg qhs PRN for insomnia. Can give Trazodone 50 mg qhs PRN if Melatonin ineffective.  -Give IM Geodon 20 mg BID PRN for agitation.  -Please pursue involuntary commitment if patient refuses voluntary psychiatric hospitalization or attempts to leave the hospital.  -Will sign off on patient at this time. Please consult psychiatry again as needed.     Disposition: Recommend psychiatric Inpatient admission when medically cleared.  Faythe Dingwall, DO 04/14/2018 1:36 PM

## 2018-04-14 NOTE — Progress Notes (Signed)
Paged for patient agitation and hallucinations. Reported hallucinating about her her mothers voice or thinking her boyfriend is in the hall. Saw patient at beside. She denies visual or auditory hallucinations when questioned and states that she cant sleep well and has bad dreams when she does. She states that ativan and trazodone have not helped her sleep. She declines Ambien as she "has seen the commercials" and is worried about potential side effects. When asked if there is anything we can so to help feel more comfortable's, she asks to smoke a cigarette. She was informed that she cannot smoke in the hospital and we need her to stay in her room, but we can and will order a nicotine patch for her. She denies headache or tactile disturbances. Hospital security and law enforcement were in the hall at the time of patient interview. - Nicotine Patch  Daily

## 2018-04-15 DIAGNOSIS — D696 Thrombocytopenia, unspecified: Secondary | ICD-10-CM

## 2018-04-15 DIAGNOSIS — F419 Anxiety disorder, unspecified: Secondary | ICD-10-CM

## 2018-04-15 DIAGNOSIS — Z915 Personal history of self-harm: Secondary | ICD-10-CM

## 2018-04-15 DIAGNOSIS — Z8669 Personal history of other diseases of the nervous system and sense organs: Secondary | ICD-10-CM

## 2018-04-15 DIAGNOSIS — R590 Localized enlarged lymph nodes: Secondary | ICD-10-CM

## 2018-04-15 DIAGNOSIS — R740 Nonspecific elevation of levels of transaminase and lactic acid dehydrogenase [LDH]: Secondary | ICD-10-CM

## 2018-04-15 DIAGNOSIS — R451 Restlessness and agitation: Secondary | ICD-10-CM

## 2018-04-15 DIAGNOSIS — K0889 Other specified disorders of teeth and supporting structures: Secondary | ICD-10-CM

## 2018-04-15 DIAGNOSIS — R7401 Elevation of levels of liver transaminase levels: Secondary | ICD-10-CM

## 2018-04-15 DIAGNOSIS — F502 Bulimia nervosa: Secondary | ICD-10-CM

## 2018-04-15 MED ORDER — HALOPERIDOL LACTATE 5 MG/ML IJ SOLN: 1.0000 mg | Freq: Four times a day (QID) | INTRAMUSCULAR | Status: DC | PRN

## 2018-04-15 MED ORDER — RAMELTEON 8 MG PO TABS
8.0000 mg | ORAL_TABLET | Freq: Every evening | ORAL | Status: DC | PRN
  Administered 2018-04-17: 8 mg via ORAL
  Filled 2018-04-15 (×2): qty 1

## 2018-04-15 MED ORDER — ZIPRASIDONE MESYLATE 20 MG IM SOLR
20.0000 mg | Freq: Two times a day (BID) | INTRAMUSCULAR | Status: DC | PRN
  Filled 2018-04-15: qty 20

## 2018-04-15 MED ORDER — LORAZEPAM 2 MG/ML IJ SOLN: 1.0000 mg | INTRAMUSCULAR | Status: DC

## 2018-04-15 MED ORDER — HALOPERIDOL 1 MG PO TABS
1.0000 mg | ORAL_TABLET | Freq: Four times a day (QID) | ORAL | Status: DC | PRN
  Filled 2018-04-15: qty 1

## 2018-04-15 MED ORDER — GABAPENTIN 600 MG PO TABS
300.0000 mg | ORAL_TABLET | Freq: Three times a day (TID) | ORAL | Status: DC
  Administered 2018-04-15 – 2018-04-18 (×10): 300 mg via ORAL
  Filled 2018-04-15 (×11): qty 1

## 2018-04-15 MED ORDER — LORAZEPAM 1 MG PO TABS
1.0000 mg | ORAL_TABLET | ORAL | Status: DC
  Administered 2018-04-15 – 2018-04-16 (×5): 1 mg via ORAL
  Filled 2018-04-15 (×9): qty 1

## 2018-04-15 NOTE — Progress Notes (Addendum)
Subjective:  Patient seen sitting comfortably in her bed this AM in no acute distress. Patient states that she is anxious and wants to leave hospital. Patient expresses much distress over her treatment yesterday and ongoing administration of medications that aren't her usual medications and do not treat anxiety.   Objective:  Vital signs in last 24 hours: Vitals:   04/14/18 0432 04/14/18 1235 04/14/18 2056 04/15/18 0542  BP: (!) 141/111 (!) 134/101 120/83 (!) 129/101  Pulse: 86 (!) 103 85 83  Resp: Temp: 98.4 F (36.9 C) 98.3 F (36.8 C) 98.7 F (37.1 C) 99 F (37.2 C)  TempSrc: Oral Oral Oral Oral  SpO2: 100% 100% 99% 99%   Physical Exam  Constitutional: She is oriented to person, place, and time.  Appears stated age. Non diaphoretic, no acute distress  HENT:  Mouth/Throat: Oropharynx is clear and moist.  Chronic poor dentition and bilateral parotid swelling, stable from previous exams  Eyes: Conjunctivae are normal. No scleral icterus.  Cardiovascular: Normal rate and regular rhythm. Exam reveals no friction rub.  No murmur heard. Respiratory: Effort normal and breath sounds normal.  Neurological: She is alert and oriented to person, place, and time.  Spontaneously moves all four extremities.   Skin: Skin is warm and dry. No rash noted. No erythema.  Psychiatric:  Calm during interview. Patient with ongoing poor insight into condition and poor overall judgement. No current SI/HI/aggression towards staff today. Ongoing risk to self and others given recent suicide attempt and aggressive threats towards staff when she does not agree with conversation during interview.   Assessment/Plan:  Principal Problem:   Suicide attempt by other psychotropic drug overdose (HCC) Active Problems:   MDD (major depressive disorder), recurrent severe, without psychosis (HCC)   Alcohol withdrawal seizure with delirium (HCC)  Jennifer Compton is a 37 yo with PMH of major  depression and alcohol use who presented for evaluation of witnessed seizure activity and altered mental status after abrupt cessation of alcohol use about 4 days prior to admission. She was admitted to the internal medicine teaching service for management. The specific problems addressed during admission are as follows:  Acute alcohol withdrawal, delerium tremens: Patient through acute alcohol withdrawal and without evidence of recurrent seizure overnight. Patient medically stable for discharge. -Med-Surg -Continue CIWA monitoring -Continue seizure precautions, safety sitter for agitation -Folic acid 1 mg, thiamin 100 mg, and multivitamin daily  Major depressive disorder, with recent intentional overdose: Patient continues to experience agitation, although much calmer today than on previous days. Patient has self-discontinued IV and refusing blood work and additional needle sticks today. Patient ongoing risk to self and others, requiring IVC on 04/14/2018. Medically stable for discharge to behavioral health facility. Patient requesting medications (Klonopin) to manage anxiety. Per psychiatrist evaluation yesterday, will not start long-acting scheduled benzodiazepine given recent OD attempt in 01/2018 on this medication. Concerned about starting SSI in patient with auditory hallucinations, manic behavior and possible hx of bipolar disorder per mother report. Patient with long-standing Bulmia and recent seizure, so Wellbutrin not good option. Wonder if daily atypical anti-psychotic would be appropriate for mood stabilization and if buspar could be initiated for anxiety symptoms. Will touch base with psychiatry regarding this today. -Safety sitter bedside -SW consulted for transfer to behavioral health for further management -Psychiatry consulted, will touch base today regarding initiation of therapy -PRN haldol and ativan as needed for agitation or aggression with staff/harm to self -IM Geodon for  restraint if patient  remains physically threatening in spite of above measures -Starting Ramelteon PRN for sleep -Per discussion with Dr. 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 No acute signs of decompensation this AM. Will need continuing follow up with PCP as outpatient regarding future workup.   FEN/GI: -Regular diet  -No IVF, check BMP tomorrow for K+ levels after replacement if patient allows  VTE Prophylaxis:Lovenox daily Code Status: Full  Dispo: Anticipated transfer to behavioral health facility, ongoing IVC 04/14/2018 through 04/21/2018.  Rozann Lesches, MD 04/15/2018, 9:54 AM Pager: [phone]

## 2018-04-15 NOTE — Progress Notes (Signed)
[facility] still does not have a bed for patient. CSW sent referral to [facility] [place] [place], [place], [place], and [place].   Osborne Casco Zaylin Pistilli LCSW [phone]

## 2018-04-15 NOTE — Progress Notes (Signed)
[facility] informed that patient had been declined due to medical.  Melbourne Abtsatia Kaelon Weekes, MSW, LCSWA Clinical social worker in disposition [facility], TTS Office [phone] and [phone] 04/15/2018 4:48 PM

## 2018-04-15 NOTE — Progress Notes (Signed)
Patient took out her iv line and refused to put new iv assess.

## 2018-04-16 LAB — BASIC METABOLIC PANEL
Anion gap: 9 (ref 5–15)
BUN: <5 mg/dL — ABNORMAL LOW (ref 6–20)
CO2: 26 mmol/L (ref 22–32)
Calcium: 9 mg/dL (ref 8.9–10.3)
Chloride: 104 mmol/L (ref 101–111)
Creatinine, Ser: 0.46 mg/dL (ref 0.44–1.00)
GFR calc Af Amer: >60 mL/min (ref >60)
GFR calc non Af Amer: >60 mL/min (ref >60)
Glucose, Bld: 87 mg/dL (ref 65–99)
Potassium: 3.3 mmol/L — ABNORMAL LOW (ref 3.5–5.1)
Sodium: 139 mmol/L (ref 135–145)

## 2018-04-16 LAB — HEPATIC FUNCTION PANEL
ALBUMIN: 3.7 g/dL (ref 3.5–5.0)
ALT: 187 U/L — ABNORMAL HIGH (ref 14–54)
AST: 170 U/L — AB (ref 15–41)
Alkaline Phosphatase: 40 U/L (ref 38–126)
Bilirubin, Direct: 0.2 mg/dL (ref 0.1–0.5)
Indirect Bilirubin: 1.1 mg/dL — ABNORMAL HIGH (ref 0.3–0.9)
TOTAL PROTEIN: 6.9 g/dL (ref 6.5–8.1)
Total Bilirubin: 1.3 mg/dL — ABNORMAL HIGH (ref 0.3–1.2)

## 2018-04-16 MED ORDER — BLISTEX MEDICATED EX OINT
TOPICAL_OINTMENT | CUTANEOUS | Status: DC | PRN
  Filled 2018-04-16: qty 6.3

## 2018-04-16 MED ORDER — ADULT MULTIVITAMIN W/MINERALS CH
1.0000 | ORAL_TABLET | Freq: Every day | ORAL | Status: DC
  Administered 2018-04-16 – 2018-04-18 (×3): 1 via ORAL
  Filled 2018-04-16 (×5): qty 1

## 2018-04-16 MED ORDER — FOLIC ACID 1 MG PO TABS
1.0000 mg | ORAL_TABLET | Freq: Every day | ORAL | Status: DC
  Administered 2018-04-16 – 2018-04-18 (×3): 1 mg via ORAL
  Filled 2018-04-16 (×4): qty 1

## 2018-04-16 MED ORDER — VITAMIN B-1 100 MG PO TABS
100.0000 mg | ORAL_TABLET | Freq: Every day | ORAL | Status: DC
  Administered 2018-04-16 – 2018-04-18 (×3): 100 mg via ORAL
  Filled 2018-04-16 (×4): qty 1

## 2018-04-16 NOTE — Progress Notes (Addendum)
UPDATE 10:41 AM:  [facility] CSW to follow.  Blenda NicelyElizabeth Refugio Vandevoorde, LCSW Clinical Social Worker [phone]     [facility] to check on [facility] of beds for the patient for today. AC was not in office; will check status and call back with information when available.  CSW to follow.  Blenda NicelyElizabeth Dove [place], Kentucky[place]CSW Clinical Social Worker [phone]

## 2018-04-16 NOTE — Progress Notes (Signed)
Pt awake, sitting up at side of bed. Alert and oriented. Asking about wearing street clothes and visitor brought belongings. Re-educated pt about policy requiring hospital attire and that patient can not have personal belongings at bedside. Bag of belongings placed in utility room per unit policy. Patient verbalized understanding. Environmental survey complete, sitter at bedside, suicide precautions maintained.

## 2018-04-16 NOTE — Progress Notes (Signed)
   Subjective:  Patient seen sitting comfortably in her bed this AM in no acute distress. No events last night, pt in good mood but still reports depression and anxiety.     Objective:  Vital signs in last 24 hours: Vitals:   04/15/18 1555 04/15/18 1800 04/15/18 2145 04/16/18 0623  BP: (!) 127/93 (!) 130/92 (!) 130/94 113/76  Pulse: (!) 108 95 93 98  Resp: 18  18 16   Temp: 98.9 F (37.2 C)  99.5 F (37.5 C) 98.7 F (37.1 C)  TempSrc: Oral  Oral   SpO2: 98%  99% 100%    Physical Exam  Constitutional: No distress.  Cardiovascular: Normal rate, regular rhythm and normal heart sounds. Exam reveals no gallop and no friction rub.  No murmur heard. Pulmonary/Chest: Effort normal and breath sounds normal. No respiratory distress. She has no wheezes. She has no rales. She exhibits no tenderness.  Abdominal: Soft. Bowel sounds are normal. She exhibits no distension and no mass. There is no tenderness. There is no rebound and no guarding.  Neurological: She is alert.  Skin: She is not diaphoretic.  Psychiatric: She has a normal mood and affect. She is not actively hallucinating.  Reports continued depression and anxiety.  No hallucinations this am She is attentive.   Assessment/Plan:  Principal Problem:   Suicide attempt by other psychotropic drug overdose (HCC) Active Problems:   MDD (major depressive disorder), recurrent severe, without psychosis (HCC)   Alcohol withdrawal seizure with delirium (HCC)   Transaminitis   Thrombocytopenia (HCC)  Naiomi Lanae Compton is a 37 yo with PMH of major depression and alcohol use who presented for evaluation of witnessed seizure activity and altered mental status after abrupt cessation of alcohol use about 4 days prior to admission. She was admitted to the internal medicine teaching service for management. The specific problems addressed during admission are as follows:  Acute alcohol withdrawal, delerium tremens: Patient through acute alcohol  withdrawal and without evidence of recurrent seizure. Patient medically stable for discharge.  -Med-Surg -Continue CIWA monitoring -Continue seizure precautions, safety sitter for agitation -Folic acid 1 mg, thiamin 100 mg, and multivitamin daily  Major depressive disorder, with recent intentional overdose: pt still reports depression but concerned more about anxiety  -Safety sitter bedside -SW consulted for transfer to behavioral health for further management -Psychiatry consulted -PRN haldol and ativan as needed for agitation or aggression with staff/harm to self -IM Geodon for restraint if patient remains physically threatening in spite of above measures -Starting Ramelteon PRN for sleep -Per discussion with Dr. Sharma CovertNorman - starting gabapentin 300 mg TID for management of anxiety/mood while inpatient [facility]  Transaminitis: Patient's LFT's elevated during admission, liver damaged from alcohol to what extent is uncertain at this time.   -alcohol cessation -pcp follow up for imaging and further workup    VTE Prophylaxis:Lovenox daily Code Status: Full  Dispo: Anticipated transfer to behavioral health facility, ongoing IVC 04/14/2018 through 04/21/2018.  [name], Paislynn Hegstrom B, MD 04/16/2018, 8:56 AM Thornell MuleBrandon Dade Rodin MD PGY-1 Internal Medicine Pager # [phone]

## 2018-04-17 NOTE — Progress Notes (Signed)
   Subjective:  Patient reports feeling well today.  She said her mood is good today, she is not complaining of anxiety today, says she has not needed PRN ativan.     Objective:  Vital signs in last 24 hours: Vitals:   04/16/18 1329 04/16/18 2038 04/17/18 0439 04/17/18 1321  BP: 110/81 102/74 106/79 109/84  Pulse: 78 90 69 76  Resp: 16 16 16    Temp: 98.4 F (36.9 C) 98.1 F (36.7 C) 97.7 F (36.5 C) 98.3 F (36.8 C)  TempSrc:  Oral Oral Oral  SpO2: 98% 100% 100% 100%    Physical Exam  Constitutional: No distress.  Cardiovascular: Normal rate, regular rhythm and normal heart sounds. Exam reveals no gallop and no friction rub.  No murmur heard. Pulmonary/Chest: Effort normal and breath sounds normal. No respiratory distress. She has no wheezes. She has no rales. She exhibits no tenderness.  Abdominal: Soft. Bowel sounds are normal. She exhibits no distension and no mass. There is no tenderness. There is no rebound and no guarding.  Neurological: She is alert.  Skin: She is not diaphoretic.  Psychiatric: She has a normal mood and affect. She is not actively hallucinating.  Reports improvement in depression and anxiety.  No hallucinations this am She is attentive.   Assessment/Plan:  Principal Problem:   Suicide attempt by other psychotropic drug overdose (HCC) Active Problems:   MDD (major depressive disorder), recurrent severe, without psychosis (HCC)   Alcohol withdrawal seizure with delirium (HCC)   Transaminitis   Thrombocytopenia (HCC)  Jennifer Compton is a 37 yo with PMH of major depression and alcohol use who presented for evaluation of witnessed seizure activity and altered mental status after abrupt cessation of alcohol use about 4 days prior to admission. She was admitted to the internal medicine teaching service for management. The specific problems addressed during admission are as follows:  Acute alcohol withdrawal, delerium tremens: Patient through acute alcohol  withdrawal and without evidence of recurrent seizure. Patient medically stable for discharge.  -Continue seizure precautions, safety sitter for agitation -Folic acid 1 mg, thiamin 100 mg, and multivitamin daily  Major depressive disorder, with recent intentional overdose: pt still reports depression but concerned more about anxiety  -Safety sitter bedside -SW consulted for transfer to behavioral health for further management -Psychiatry consulted -PRN haldol and ativan as needed for agitation or aggression with staff/harm to self -IM Geodon for restraint if patient remains physically threatening in spite of above measures -Starting Ramelteon PRN for sleep -Per discussion with Dr. Sharma CovertNorman - starting gabapentin 300 mg TID for management of anxiety/mood while inpatient and awaiting [facility]  Transaminitis: Patient's LFT's elevated during admission, liver damaged from alcohol to what extent is uncertain at this time.   -alcohol cessation -pcp follow up for imaging and further workup    VTE Prophylaxis:Lovenox daily Code Status: Full  Dispo: Anticipated transfer to behavioral health facility, ongoing IVC 04/14/2018 through 04/21/2018.  [name], Lawarence Meek B, MD 04/17/2018, 3:26 PM Thornell MuleBrandon Courtni Balash MD PGY-1 Internal Medicine Pager # [phone]

## 2018-04-18 DIAGNOSIS — D539 Nutritional anemia, unspecified: Secondary | ICD-10-CM

## 2018-04-18 DIAGNOSIS — D696 Thrombocytopenia, unspecified: Secondary | ICD-10-CM

## 2018-04-18 MED ORDER — GABAPENTIN 600 MG PO TABS: 300.0000 mg | ORAL_TABLET | Freq: Three times a day (TID) | ORAL | 0 refills | Status: AC

## 2018-04-18 MED ORDER — RAMELTEON 8 MG PO TABS: 8.0000 mg | ORAL_TABLET | Freq: Every evening | ORAL | 0 refills | Status: AC | PRN

## 2018-04-18 MED ORDER — THIAMINE HCL 100 MG PO TABS: 100.0000 mg | ORAL_TABLET | Freq: Every day | ORAL | 1 refills | Status: AC

## 2018-04-18 MED ORDER — ADULT MULTIVITAMIN W/MINERALS CH: 1.0000 | ORAL_TABLET | Freq: Every day | ORAL | 1 refills | Status: AC

## 2018-04-18 MED ORDER — FOLIC ACID 1 MG PO TABS: 1.0000 mg | ORAL_TABLET | Freq: Every day | ORAL | 1 refills | Status: AC

## 2018-04-18 NOTE — Consult Note (Signed)
[facility] Psych Consult Progress Note  04/18/2018 1:40 PM Ewing SchleinCamila Compton  MRN:  [id number] Subjective:  [facility] Ms. Jennifer Compton was last seen by psychiatry on 5/30 and inpatient psychiatric hospitalization was recommended due to suicide attempt by overdose with Trazodone (#8) and Benadryl (#2). Gabapentin 300 mg TID was started on 5/31 for anxiety and alcohol withdrawal.   On interview, Ms. Jennifer Compton reports that she is doing well.  She reports that her anxiety is much better.  She would like to [facility] for medication management and therapy.  She denies SI, HI or AVH.  She reports no problems with sleep or appetite.  She denies cravings for alcohol.  She believes that her suicide attempt was influenced by alcohol use.  She does not plan to drink again and her boyfriend plans to help her maintain her sobriety.  She provides verbal consent to speak to her boyfriend.  He reports that he has no safety concerns at this time.  He is agreeable to monitoring her for safety.  He reports that there is a gun at home although there are no bullets and it is properly stored.  Principal Problem: Suicide attempt by other psychotropic drug overdose [facility](HCC) Diagnosis:   Patient Active Problem List   Diagnosis Date Noted  . Transaminitis [R74.0] 04/15/2018  . Thrombocytopenia (HCC) [D69.6] 04/15/2018  . Alcohol withdrawal seizure with delirium (HCC) [F10.231] 04/13/2018  . Suicide attempt by other psychotropic drug overdose (HCC) [T43.8X2A] 04/13/2018  . Delirium tremens (HCC) [F10.231] 04/12/2018  . MDD (major depressive disorder), recurrent severe, without psychosis (HCC) [F33.2] 04/11/2018   Total Time spent with patient: 15 minutes  Past Psychiatric History: Anxiety, depression and alcohol abuse.   Past Medical History:  Past Medical History:  Diagnosis Date  . Anxiety   . Asthma   . Depression   . GERD (gastroesophageal reflux disease)   . Seizures (HCC) 04/13/2018    Past Surgical History:  Procedure  Laterality Date  . TONSILLECTOMY     Family History: History reviewed. No pertinent family history. Family Psychiatric  History: Maternal grandmother-depression and anxiety and mother-anxiety.   Social History:  Social History   Substance and Sexual Activity  Alcohol Use Yes  . Alcohol/week: 67.2 oz  . Types: 112 Cans of beer per week   Comment: 04/13/2018 "12, 160z beers/day"     Social History   Substance and Sexual Activity  Drug Use Not Currently    Social History   Socioeconomic History  . Marital status: Single    Spouse name: Not on file  . Number of children: Not on file  . Years of education: Not on file  . Highest education level: Not on file  Occupational History  . Not on file  Social Needs  . Financial resource strain: Not on file  . Food insecurity:    Worry: Not on file    Inability: Not on file  . Transportation needs:    Medical: Not on file    Non-medical: Not on file  Tobacco Use  . Smoking status: Current Every Day Smoker    Packs/day: 0.50    Years: 20.00    Pack years: 10.00    Types: Cigarettes  . Smokeless tobacco: Never Used  Substance and Sexual Activity  . Alcohol use: Yes    Alcohol/week: 67.2 oz    Types: 112 Cans of beer per week    Comment: 04/13/2018 "12, 160z beers/day"  . Drug use: Not Currently  . Sexual activity:  Not Currently  Lifestyle  . Physical activity:    Days per week: Not on file    Minutes per session: Not on file  . Stress: Not on file  Relationships  . Social connections:    Talks on phone: Not on file    Gets together: Not on file    Attends religious service: Not on file    Active member of club or organization: Not on file    Attends meetings of clubs or organizations: Not on file    Relationship status: Not on file  Other Topics Concern  . Not on file  Social History Narrative  . Not on file    Sleep: Good  Appetite:  Good  Current Medications: Current Facility-Administered Medications   Medication Dose Route Frequency Provider Last Rate Last Dose  . acetaminophen (TYLENOL) tablet 650 mg  650 mg Oral Q6H PRN Nedrud, Jeanella Flattery, MD   650 mg at 04/17/18 1613   Or  . acetaminophen (TYLENOL) suppository 650 mg  650 mg Rectal Q6H PRN Nedrud, Jeanella Flattery, MD      . enoxaparin (LOVENOX) injection 40 mg  40 mg Subcutaneous Q24H Nedrud, Marybeth, MD   40 mg at 04/17/18 1614  . folic acid (FOLVITE) tablet 1 mg  1 mg Oral Daily Angelita Ingles, MD   1 mg at 04/18/18 1020  . gabapentin (NEURONTIN) tablet 300 mg  300 mg Oral TID Rozann Lesches, MD   300 mg at 04/18/18 1021  . haloperidol (HALDOL) tablet 1 mg  1 mg Oral Q6H PRN Nedrud, Jeanella Flattery, MD       Or  . haloperidol lactate (HALDOL) injection 1 mg  1 mg Intramuscular Q6H PRN Nedrud, Marybeth, MD      . lip balm (BLISTEX) ointment   Topical PRN Anne Shutter, MD      . LORazepam (ATIVAN) tablet 1 mg  1 mg Oral Q4H Nedrud, Jeanella Flattery, MD   1 mg at 04/16/18 0018   Or  . LORazepam (ATIVAN) injection 1 mg  1 mg Intramuscular Q4H Nedrud, Marybeth, MD      . multivitamin with minerals tablet 1 tablet  1 tablet Oral Daily Angelita Ingles, MD   1 tablet at 04/18/18 1020  . nicotine (NICODERM CQ - dosed in mg/24 hours) patch 21 mg  21 mg Transdermal Daily Nyra Market, MD   21 mg at 04/18/18 1028  . ramelteon (ROZEREM) tablet 8 mg  8 mg Oral QHS PRN Rozann Lesches, MD   8 mg at 04/17/18 2220  . thiamine (VITAMIN B-1) tablet 100 mg  100 mg Oral Daily Angelita Ingles, MD   100 mg at 04/18/18 1020  . ziprasidone (GEODON) injection 20 mg  20 mg Intramuscular BID PRN Rozann Lesches, MD        Musculoskeletal: Strength & Muscle Tone: within normal limits Gait & Station: UTA since patient was lying in bed. Patient leans: N/A  Psychiatric Specialty Exam: Physical Exam  Nursing note and vitals reviewed. Constitutional: She is oriented to person, place, and time. She appears well-developed and well-nourished.  HENT:  Head:  Normocephalic and atraumatic.  Neck: Normal range of motion.  Respiratory: Effort normal.  Musculoskeletal: Normal range of motion.  Neurological: She is alert and oriented to person, place, and time.  Skin: No rash noted.  Psychiatric: She has a normal mood and affect. Her speech is normal and behavior is normal. Judgment and thought content normal. Cognition and memory are normal.  Review of Systems  Gastrointestinal: Negative for constipation, diarrhea, nausea and vomiting.  Psychiatric/Behavioral: Positive for substance abuse. Negative for depression, hallucinations and suicidal ideas. The patient is not nervous/anxious and does not have insomnia.   All other systems reviewed and are negative.   Blood pressure 114/81, pulse 79, temperature 98.1 F (36.7 C), temperature source Oral, resp. rate 16, SpO2 99 %.There is no height or weight on file to calculate BMI.  General Appearance: Fairly Groomed, young, Caucasian female, wearing a paper scrubs with a short bob cut and sitting up in bed. NAD.  Eye Contact:  Good  Speech:  Clear and Coherent and Normal Rate  Volume:  Normal  Mood:  Euthymic  Affect:  Appropriate and Congruent  Thought Process:  Goal Directed, Linear and Descriptions of Associations: Intact  Orientation:  Full (Time, Place, and Person)  Thought Content:  Logical  Suicidal Thoughts:  No  Homicidal Thoughts:  No  Memory:  Immediate;   Good Recent;   Good Remote;   Good  Judgement:  Fair  Insight:  Fair  Psychomotor Activity:  Normal  Concentration:  Concentration: Good and Attention Span: Good  Recall:  Good  Fund of Knowledge:  Good  Language:  Good  Akathisia:  No  Handed:  Right  AIMS (if indicated):   N/A  Assets:  Communication Skills Desire for Improvement Housing Intimacy Social Support  ADL's:  Intact  Cognition:  WNL  Sleep:   Okay   Assessment:  Ahnya Akre is a 37 y.o. female who was admitted with alcohol withdrawal seizure from [facility]. She  was initially admitted to [facility] for Trazodone and Benadryl overdose. She reports an improvement in her mood and anxiety since hospitalization and likely secondary to completing alcohol detox. She denies SI, HI or AVH and does not appear to be responding to internal stimuli. She is future oriented and is able to safety plan. She will most likely benefit from outpatient treatment at this time. Her boyfriend agrees to monitor patient for safety and will assist her with following up with her outpatient provider.   Treatment Plan Summary: -Continue Gabapentin 300 mg TID for alcohol cravings/anxiety. -Recommend against restarting benzodiazepines due to alcohol use and risk of lethality with overdose.  -Patient will follow up with outpatient provider at discharge.  -Patient is psychiatrically cleared. Psychiatry will sign off on patient at this time. Please consult psychiatry again as needed.   Cherly Beach, DO 04/18/2018, 1:40 PM

## 2018-04-18 NOTE — Progress Notes (Addendum)
12:40pm-[facility] Psychiatrist does not believe patient meets criteria. CSW asked MD for psych to be re-consulted.   11:45am-[facility] does not have beds available. [facility] is reviewing referral and will contact CSW back.   Osborne Cascoadia Kyshawn Teal LCSW [phone]

## 2018-04-18 NOTE — Progress Notes (Addendum)
   Subjective:  Patient reports feeling well today.  She said her mood is good today, not feeling depressed or needing anxiety medicine, she is eager to go to behavioral health to further her treatment and do group therapy AA etc.    Objective:  Vital signs in last 24 hours: Vitals:   04/17/18 0439 04/17/18 1321 04/17/18 2057 04/18/18 0606  BP: 106/79 109/84 102/66 114/81  Pulse: 69 76 71 79  Resp: 16  16 16   Temp: 97.7 F (36.5 C) 98.3 F (36.8 C) 98 F (36.7 C) 98.1 F (36.7 C)  TempSrc: Oral Oral Oral Oral  SpO2: 100% 100% 99% 99%    Physical Exam  Constitutional: No distress.  Cardiovascular: Normal rate, regular rhythm and normal heart sounds. Exam reveals no gallop and no friction rub.  No murmur heard. Pulmonary/Chest: Effort normal and breath sounds normal. No respiratory distress. She has no wheezes. She has no rales. She exhibits no tenderness.  Abdominal: Soft. Bowel sounds are normal. She exhibits no distension and no mass. There is no tenderness. There is no rebound and no guarding.  Neurological: She is alert.  Skin: She is not diaphoretic.  Psychiatric: She has a normal mood and affect. She is not actively hallucinating.  Reports improvement in depression and anxiety.  No hallucinations this am She is attentive.   Assessment/Plan:  Principal Problem:   Suicide attempt by other psychotropic drug overdose (HCC) Active Problems:   MDD (major depressive disorder), recurrent severe, without psychosis (HCC)   Alcohol withdrawal seizure with delirium (HCC)   Transaminitis   Thrombocytopenia (HCC)  Jennifer Compton is a 37 yo with PMH of major depression and alcohol use who presented for evaluation of witnessed seizure activity and altered mental status after abrupt cessation of alcohol use about 4 days prior to admission. She was admitted to the internal medicine teaching service for management. The specific problems addressed during admission are as follows:  Acute  alcohol withdrawal, delerium tremens: Patient through acute alcohol withdrawal and without evidence of recurrent seizure. Patient medically stable for discharge.  -Folic acid 1 mg, thiamin 100 mg, and multivitamin daily  Major depressive disorder, with recent intentional overdose: pt still reports depression but concerned more about anxiety  -Safety sitter bedside -SW consulted for transfer to behavioral health for further management -Psychiatry evaluated pt -PRN haldol and ativan as needed for agitation or aggression with staff/harm to self -IM Geodon for restraint if patient remains physically threatening in spite of above measures -Starting Ramelteon PRN for sleep -Per discussion with Dr. Sharma CovertNorman - starting gabapentin 300 mg TID for management of anxiety/mood while inpatient and [facility]  Transaminitis: Patient's LFT's elevated during admission, liver damaged from alcohol to what extent is uncertain at this time.   -alcohol cessation -pcp follow up for imaging and further workup   VTE Prophylaxis:Lovenox daily Code Status: Full  Dispo: Anticipated transfer to behavioral health facility, ongoing IVC 04/14/2018 through 04/21/2018.  [name], [name], MD 04/18/2018, 10:22 AM [name] Pager # [phone]

## 2018-04-18 NOTE — Consult Note (Signed)
I was asked to accept this patient at [facility]. Chart reviewed.   It is unclear, and rather doubtful, that the patient still meets criteria for psychiatric admission. Psychiatry consult was done on 5/30.  The patient completed alcohol detox and likely needs longer term rehab. I do not see any evidence that she is still suicidal. Borderline patients usually do not benefit from extended psychiatric hospitalizations.   Please reassess and contact us if admission still necessary.

## 2018-04-18 NOTE — Progress Notes (Signed)
CSW notified that patient has been cleared by psych to return home. CSW rescinding patient's IVC with MD. Follow up mental health provider info placed on After Visit Summary.   CSW signing off.  Osborne Cascoadia Quavis Klutz LCSW [phone]

## 2018-04-18 NOTE — Progress Notes (Addendum)
At 1840  Discharge instructions reviewed with pt.  Copy of instructions and script given to pt and all other scripts were sent to pt's pharmacy by MD. Suicide resources reviewed with pt and handout provided in pt's discharge instructions.   At 1909  Pt d/c'd with belongings, pt's significant other here to take home, pt declined to ride a wheelchair out to car, steady gait,  staff walked with pt and sig other out to entrance.              Escorted by unit staff.

## 2018-04-25 NOTE — H&P (Signed)
Asked to examine patient post seizure like activity. [facility] from [facility] after a overdose of trazodone in a suicide attempt. Patient has a history of alcoholism and bulimia. Per records patient has a 15 year history of alcoholism and drinks 10 or more 24 ounce beers per day. Seizure activity was witnessed and lasted approximately 2 minutes. Patient was sitting when activity began and was assisted to floor by staff. On exam patient is disoriented to place, time, and situation. No evidence of injury. Patient is unable to provide any information on history at this time. No history of a seizure disorder is noted in records from [facility]. [facility] for [facility] examination.   Unable to complete full H&P due to patient's altered mental status.

## 2019-04-06 IMAGING — CT CT HEAD W/O CM
4 series · 16 of 47 positions shown, 18 images · non-contrast
Comparison: None.

CLINICAL DATA: History of tonic clonic seizure, on alcohol
detoxification for 3 days

EXAM:
CT HEAD WITHOUT CONTRAST
TECHNIQUE: Contiguous axial images were obtained from the base of the skull
through the vertex without intravenous contrast.

[Series 3: head without · axial · non-contrast · 0.45mm/px · z∈[-80,+35]mm · 7 of 31 slices shown, 9 images]
[im 4/31  brain]
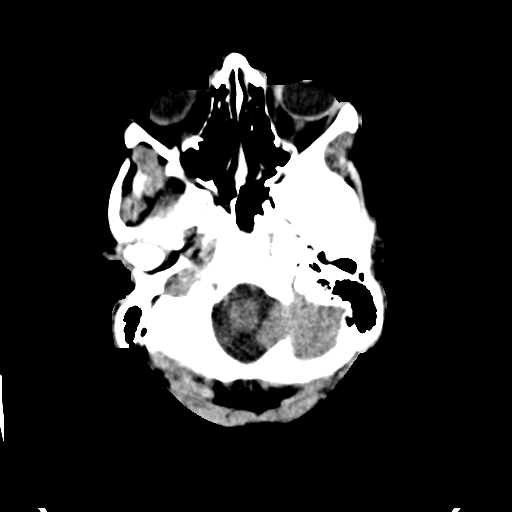
[im 4/31  bone]
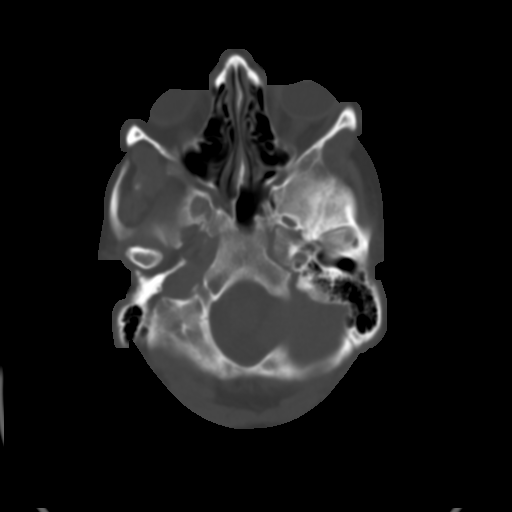
[im 8/31  brain]
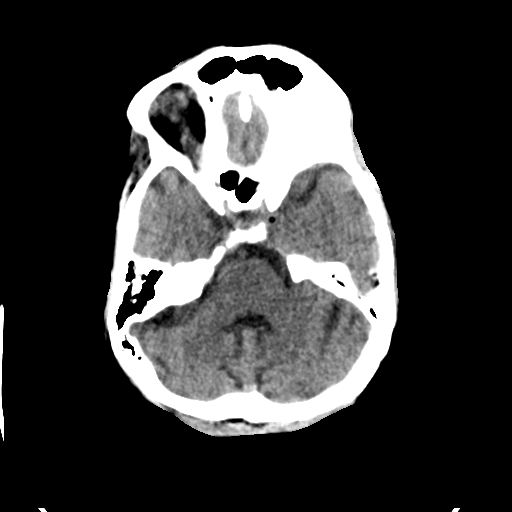
[im 12/31  brain]
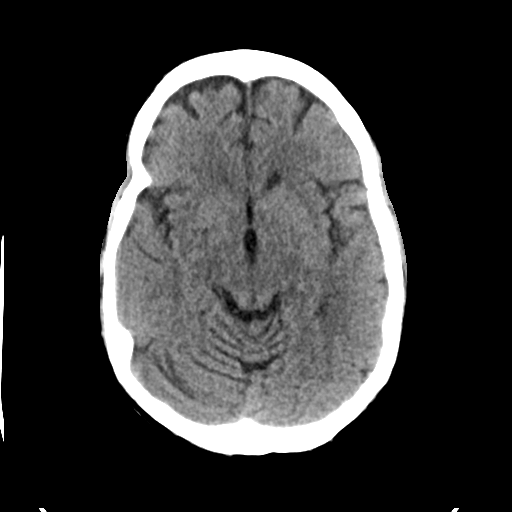
[im 16/31  brain]
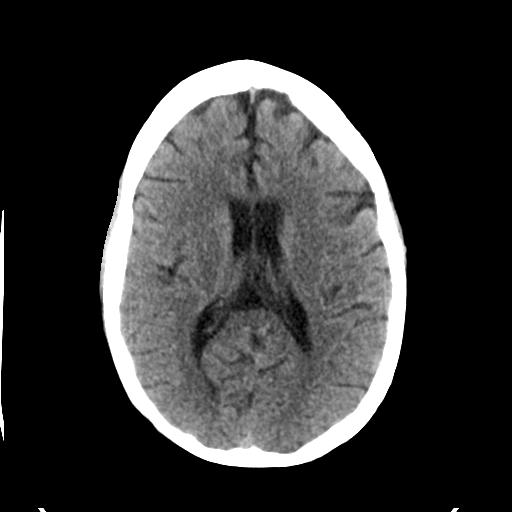
[im 19/31  brain]
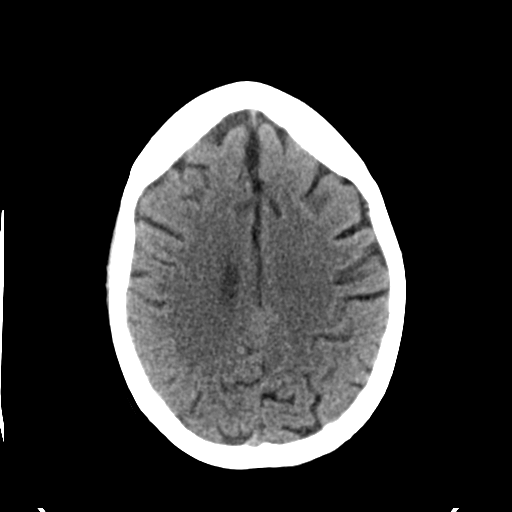
[im 19/31  bone]
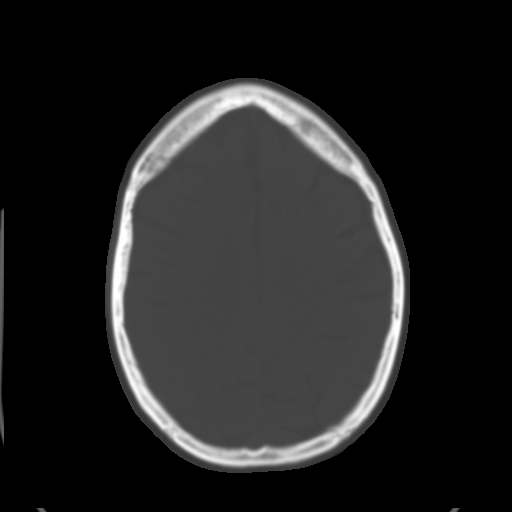
[im 23/31  brain]
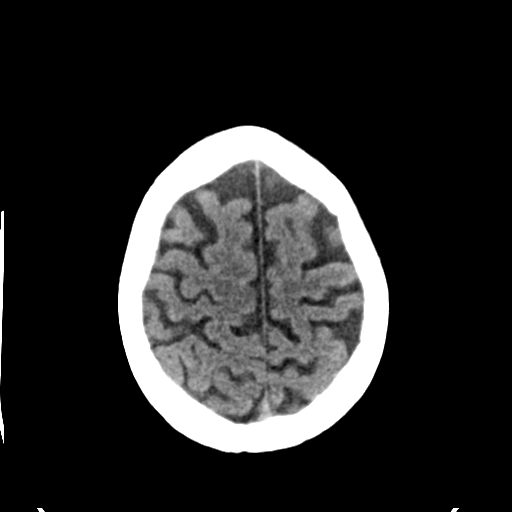
[im 27/31  brain]
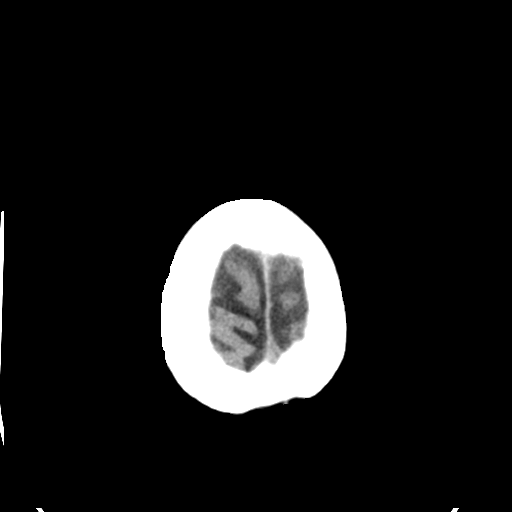

[Series 4: head bone · axial · 0.45mm/px · z∈[-81,-51]mm · 3 of 76 slices shown]
[im 8/76  bone]
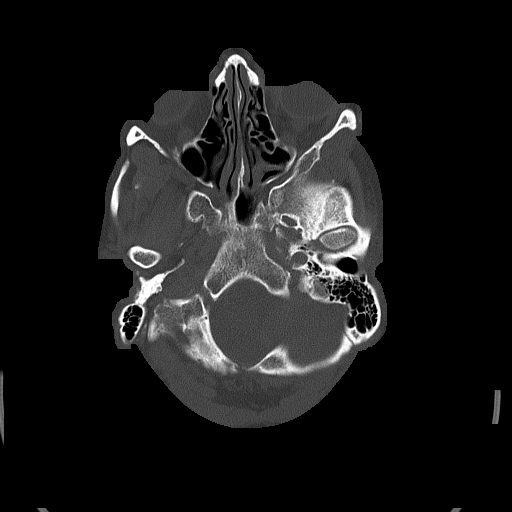
[im 16/76  bone]
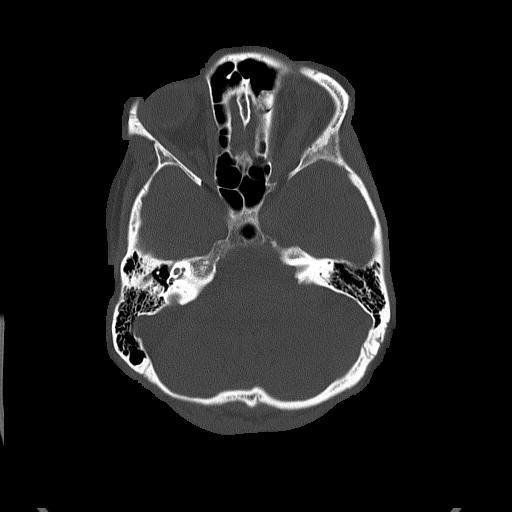
[im 23/76  bone]
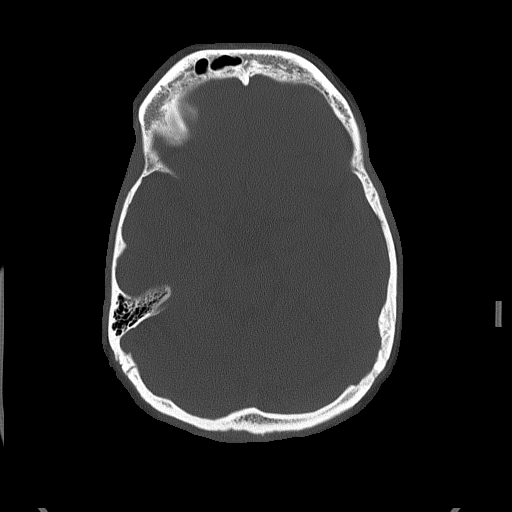

[Series 5: head without cor · coronal · non-contrast · 0.29mm/px · 3 of 67 slices shown]
[im 23/67  brain]
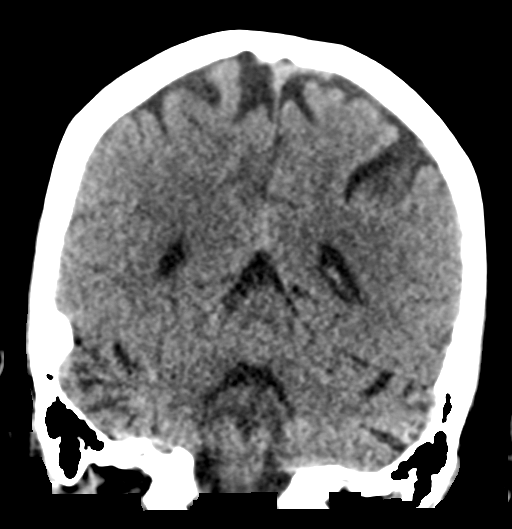
[im 30/67  brain]
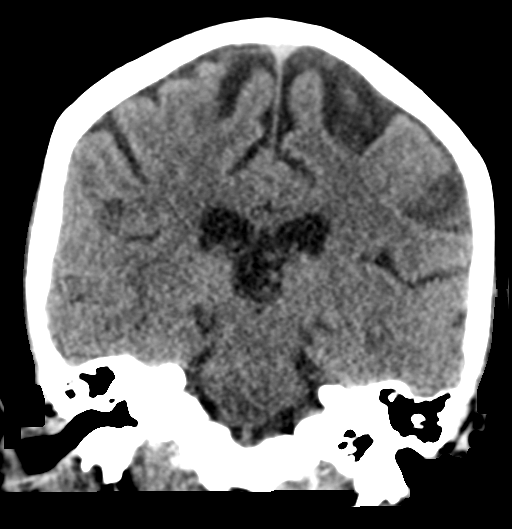
[im 37/67  brain]
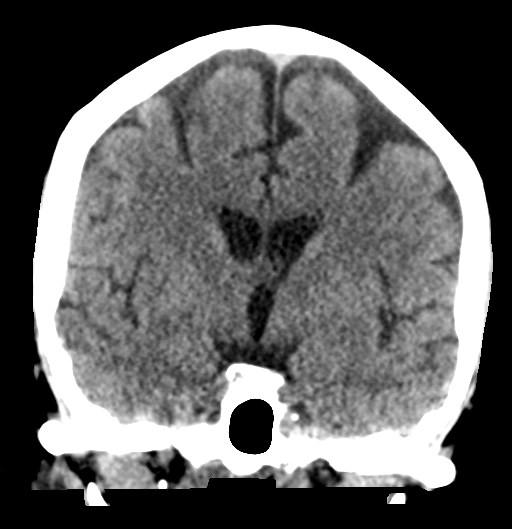

[Series 6: head without sag · sagittal · non-contrast · 0.30mm/px · 3 of 58 slices shown]
[im 20/58  brain]
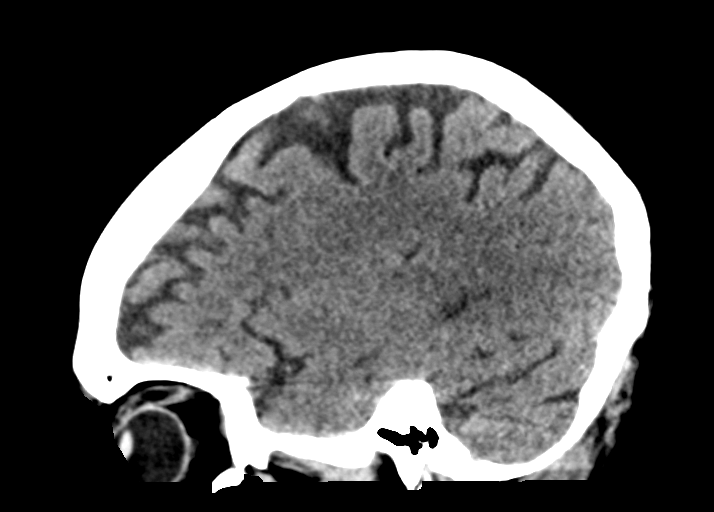
[im 29/58  brain]
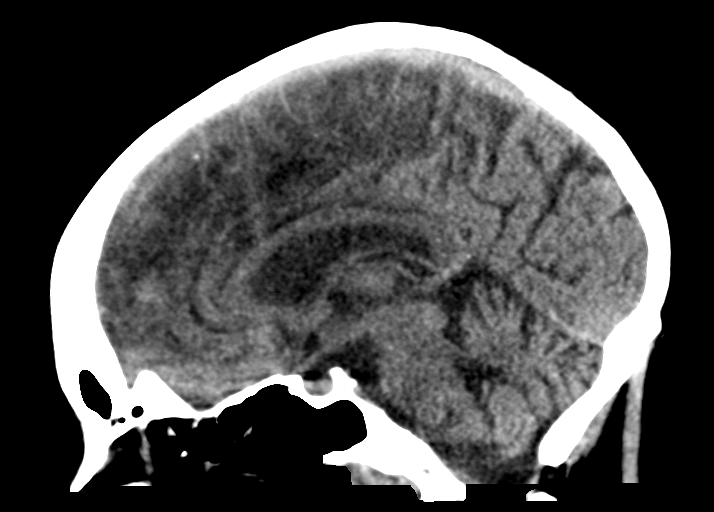
[im 39/58  brain]
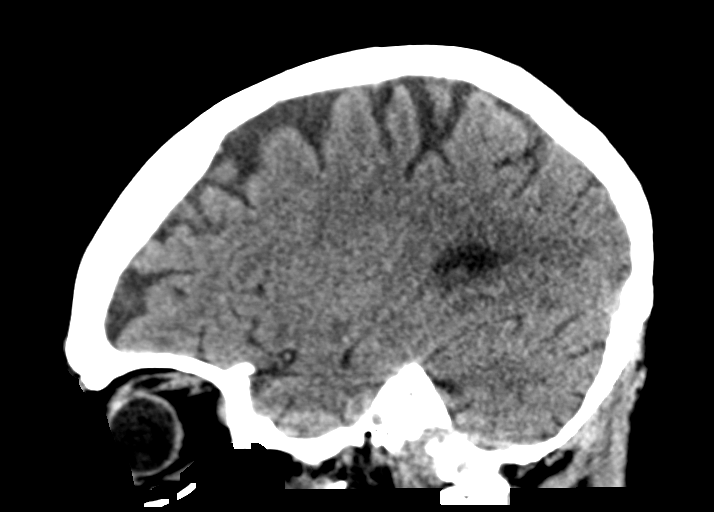

[16 of 47 positions shown; findings below may reference images not displayed]

FINDINGS: Brain: The ventricular system is minimally prominent as are the
cortical sulci suggesting a mild degree of atrophy. The septum is
midline in position. The fourth ventricle and basilar cisterns are
unremarkable. No hemorrhage, mass lesion, or acute infarction is
seen. A small area of low-attenuation within their anterior limb of
the internal capsule on the right could represent a small lacunar
infarct.

Vascular: No vascular abnormality is seen on this unenhanced study.

Skull: On bone window images, no calvarial abnormality is seen.

Sinuses/Orbits: The paranasal sinuses appear well pneumatized. And
osteoma is noted within the inferior medial left frontal sinus.

Other: None.
IMPRESSION: 1. Very mild atrophy.  No acute intracranial abnormality.
2. Cannot exclude a small old lacunar infarct in the anterior limb
of the right internal capsule.
